# Patient Record
Sex: Female | Born: 1975 | Race: White | Hispanic: No | Smoking: Never smoker
Health system: Southern US, Community
[De-identification: ages and names within clinical notes are randomized; demographics above are authoritative.]

## PROBLEM LIST (undated history)

## (undated) DIAGNOSIS — F419 Anxiety disorder, unspecified: Secondary | ICD-10-CM

## (undated) DIAGNOSIS — K219 Gastro-esophageal reflux disease without esophagitis: Secondary | ICD-10-CM

## (undated) DIAGNOSIS — R519 Headache, unspecified: Secondary | ICD-10-CM

## (undated) DIAGNOSIS — C50919 Malignant neoplasm of unspecified site of unspecified female breast: Secondary | ICD-10-CM

## (undated) DIAGNOSIS — F909 Attention-deficit hyperactivity disorder, unspecified type: Secondary | ICD-10-CM

## (undated) DIAGNOSIS — N809 Endometriosis, unspecified: Secondary | ICD-10-CM

## (undated) DIAGNOSIS — F325 Major depressive disorder, single episode, in full remission: Secondary | ICD-10-CM

## (undated) DIAGNOSIS — Z87442 Personal history of urinary calculi: Secondary | ICD-10-CM

## (undated) DIAGNOSIS — F32A Depression, unspecified: Secondary | ICD-10-CM

## (undated) DIAGNOSIS — Z1379 Encounter for other screening for genetic and chromosomal anomalies: Secondary | ICD-10-CM

## (undated) DIAGNOSIS — R51 Headache: Secondary | ICD-10-CM

## (undated) DIAGNOSIS — F329 Major depressive disorder, single episode, unspecified: Secondary | ICD-10-CM

## (undated) HISTORY — DX: Major depressive disorder, single episode, in full remission: F32.5

## (undated) HISTORY — DX: Depression, unspecified: F32.A

## (undated) HISTORY — PX: TONSILLECTOMY: SUR1361

## (undated) HISTORY — DX: Encounter for other screening for genetic and chromosomal anomalies: Z13.79

## (undated) HISTORY — DX: Attention-deficit hyperactivity disorder, unspecified type: F90.9

## (undated) HISTORY — DX: Major depressive disorder, single episode, unspecified: F32.9

## (undated) HISTORY — DX: Endometriosis, unspecified: N80.9

---

## 2003-03-09 DIAGNOSIS — N809 Endometriosis, unspecified: Secondary | ICD-10-CM

## 2003-03-09 HISTORY — PX: OTHER SURGICAL HISTORY: SHX169

## 2003-03-09 HISTORY — DX: Endometriosis, unspecified: N80.9

## 2005-05-17 ENCOUNTER — Ambulatory Visit: Payer: Self-pay | Admitting: Gynecology

## 2005-05-31 ENCOUNTER — Encounter: Admission: RE | Admit: 2005-05-31 | Discharge: 2005-05-31 | Payer: Self-pay | Admitting: Gynecology

## 2007-05-02 ENCOUNTER — Other Ambulatory Visit: Payer: Self-pay

## 2007-05-02 ENCOUNTER — Emergency Department: Payer: Self-pay | Admitting: Emergency Medicine

## 2009-11-19 ENCOUNTER — Inpatient Hospital Stay: Payer: Self-pay | Admitting: Psychiatry

## 2010-08-29 ENCOUNTER — Emergency Department: Payer: Self-pay | Admitting: Internal Medicine

## 2010-10-29 LAB — HM MAMMOGRAPHY

## 2010-10-29 LAB — HM PAP SMEAR

## 2014-09-24 ENCOUNTER — Ambulatory Visit
Admission: RE | Admit: 2014-09-24 | Discharge: 2014-09-24 | Disposition: A | Discharge status: Home / Self Care | Payer: Worker's Compensation | Source: Ambulatory Visit | Attending: Physician Assistant | Admitting: Physician Assistant

## 2014-09-24 ENCOUNTER — Other Ambulatory Visit: Payer: Self-pay | Admitting: Physician Assistant

## 2014-09-24 DIAGNOSIS — S66812A Strain of other specified muscles, fascia and tendons at wrist and hand level, left hand, initial encounter: Secondary | ICD-10-CM | POA: Diagnosis not present

## 2014-09-24 DIAGNOSIS — S56212A Strain of other flexor muscle, fascia and tendon at forearm level, left arm, initial encounter: Secondary | ICD-10-CM

## 2014-09-24 DIAGNOSIS — Y939 Activity, unspecified: Secondary | ICD-10-CM | POA: Insufficient documentation

## 2014-09-24 DIAGNOSIS — X58XXXA Exposure to other specified factors, initial encounter: Secondary | ICD-10-CM | POA: Insufficient documentation

## 2014-09-24 DIAGNOSIS — S59902A Unspecified injury of left elbow, initial encounter: Secondary | ICD-10-CM | POA: Diagnosis present

## 2014-10-23 ENCOUNTER — Other Ambulatory Visit: Payer: Self-pay | Admitting: Physician Assistant

## 2014-11-28 ENCOUNTER — Other Ambulatory Visit: Payer: Self-pay | Admitting: Physician Assistant

## 2015-02-18 DIAGNOSIS — E669 Obesity, unspecified: Secondary | ICD-10-CM

## 2015-02-18 DIAGNOSIS — Z6841 Body Mass Index (BMI) 40.0 and over, adult: Secondary | ICD-10-CM

## 2015-02-18 DIAGNOSIS — J309 Allergic rhinitis, unspecified: Secondary | ICD-10-CM | POA: Insufficient documentation

## 2015-02-18 DIAGNOSIS — M545 Low back pain, unspecified: Secondary | ICD-10-CM | POA: Insufficient documentation

## 2015-02-18 DIAGNOSIS — J45909 Unspecified asthma, uncomplicated: Secondary | ICD-10-CM | POA: Insufficient documentation

## 2015-02-18 DIAGNOSIS — F325 Major depressive disorder, single episode, in full remission: Secondary | ICD-10-CM | POA: Insufficient documentation

## 2015-02-18 DIAGNOSIS — E66813 Obesity, class 3: Secondary | ICD-10-CM | POA: Insufficient documentation

## 2015-02-18 DIAGNOSIS — F909 Attention-deficit hyperactivity disorder, unspecified type: Secondary | ICD-10-CM | POA: Insufficient documentation

## 2015-02-18 HISTORY — DX: Major depressive disorder, single episode, in full remission: F32.5

## 2015-02-19 ENCOUNTER — Encounter: Payer: Self-pay | Admitting: Unknown Physician Specialty

## 2015-02-19 ENCOUNTER — Ambulatory Visit (INDEPENDENT_AMBULATORY_CARE_PROVIDER_SITE_OTHER): Payer: BLUE CROSS/BLUE SHIELD | Admitting: Unknown Physician Specialty

## 2015-02-19 VITALS — BP 139/82 | HR 80 | Temp 98.7°F | Ht 62.6 in | Wt 240.6 lb

## 2015-02-19 DIAGNOSIS — K9041 Non-celiac gluten sensitivity: Secondary | ICD-10-CM

## 2015-02-19 DIAGNOSIS — F909 Attention-deficit hyperactivity disorder, unspecified type: Secondary | ICD-10-CM

## 2015-02-19 DIAGNOSIS — Z0000 Encounter for general adult medical examination without abnormal findings: Secondary | ICD-10-CM | POA: Diagnosis not present

## 2015-02-19 DIAGNOSIS — E669 Obesity, unspecified: Secondary | ICD-10-CM | POA: Diagnosis not present

## 2015-02-19 MED ORDER — NALTREXONE-BUPROPION HCL ER 8-90 MG PO TB12: 8.0000 mg | ORAL_TABLET | Freq: Two times a day (BID) | ORAL | Status: DC

## 2015-02-19 NOTE — Assessment & Plan Note (Signed)
Rx for contrave

## 2015-02-19 NOTE — Assessment & Plan Note (Signed)
Will consider rx at a later date after starting Contrave

## 2015-02-19 NOTE — Progress Notes (Signed)
a  BP 139/82 mmHg  Pulse 80  Temp(Src) 98.7 F (37.1 C)  Ht 5' 2.6" (1.59 m)  Wt 240 lb 9.6 oz (109.135 kg)  BMI 43.17 kg/m2  SpO2 98%  LMP 01/29/2015 (Approximate)   Subjective:    Patient ID: Shannon Allen, female    DOB: 12-Sep-1975, 39 y.o.   MRN: [id number]  HPI: Shannon Allen is a 39 y.o. female  Chief Complaint  Patient presents with  . Annual Exam    pt states she wants to see about getting on Contrave and something for ADHD   Obesity Pt does pap smears at [facility].  They started her on Contrave which worked well for her and took it for for about 6 months.  States it worked until stopped it and discovered Teacher, English as a foreign language to go."  ADHD   States she would like to start ADHD medication again because she "can't remember" to manage things she needs to manage.  She was seen 2 years ago and started ADHD meds and lost her insurance and was lost to f/u.   States she has difficulty with conversation and finds that she cuts people off.  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  She states the ADHD causes social, work difficulties, and has trouble completing personal tasks such as housework.    Relevant past medical, surgical, family and social history reviewed and updated as indicated. Interim medical history since our last visit reviewed. Allergies and medications reviewed and updated.  Review of Systems  Constitutional: Negative.   HENT: Negative.   Eyes: Negative.   Respiratory: Negative.   Cardiovascular: Negative.   Gastrointestinal: Negative.        Having trouble with nausea and diarrhea with wheat  Endocrine: Negative.   Genitourinary: Negative.   Musculoskeletal: Negative.        Periodic left lower arm pain from a work injury.  Takes the occasional Meloxicam  Skin: Negative.   Allergic/Immunologic: Negative.   Neurological: Negative.   Hematological: Negative.   Psychiatric/Behavioral: Negative.      Per HPI unless specifically indicated above     Objective:    BP 139/82 mmHg  Pulse 80  Temp(Src) 98.7 F (37.1 C)  Ht 5' 2.6" (1.59 m)  Wt 240 lb 9.6 oz (109.135 kg)  BMI 43.17 kg/m2  SpO2 98%  LMP 01/29/2015 (Approximate)  Wt Readings from Last 3 Encounters:  02/19/15 240 lb 9.6 oz (109.135 kg)  10/30/12 211 lb (95.709 kg)    Physical Exam  Constitutional: She is oriented to person, place, and time. She appears well-developed and well-nourished.  HENT:  Head: Normocephalic and atraumatic.  Eyes: Pupils are equal, round, and reactive to light. Right eye exhibits no discharge. Left eye exhibits no discharge. No scleral icterus.  Neck: Normal range of motion. Neck supple. Carotid bruit is not present. No thyromegaly present.  Cardiovascular: Normal rate, regular rhythm and normal heart sounds.  Exam reveals no gallop and no friction rub.   No murmur heard. Pulmonary/Chest: Effort normal and breath sounds normal. No respiratory distress. She has no wheezes. She has no rales.  Abdominal: Soft. Bowel sounds are normal. There is no tenderness. There is no rebound.  Musculoskeletal: Normal range of motion.  Lymphadenopathy:    She has no cervical adenopathy.  Neurological: She is alert and oriented to person, place, and time.  Skin: Skin is warm, dry and intact. No rash noted.  Psychiatric: She has a normal mood and affect. Her speech is normal and behavior is normal. Judgment and thought content normal. Cognition and memory are normal.    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

## 2015-02-20 ENCOUNTER — Encounter: Payer: Self-pay | Admitting: Unknown Physician Specialty

## 2015-02-20 LAB — HIV ANTIBODY (ROUTINE TESTING W REFLEX): HIV Screen 4th Generation wRfx: NONREACTIVE

## 2015-02-20 LAB — CBC WITH DIFFERENTIAL/PLATELET
BASOS ABS: 0 10*3/uL (ref 0.0–0.2)
BASOS: 0 %
EOS (ABSOLUTE): 0.2 10*3/uL (ref 0.0–0.4)
EOS: 2 %
Hematocrit: 38.7 % (ref 34.0–46.6)
Hemoglobin: 12.6 g/dL (ref 11.1–15.9)
IMMATURE GRANS (ABS): 0 10*3/uL (ref 0.0–0.1)
Immature Granulocytes: 0 %
LYMPHS ABS: 3.1 10*3/uL (ref 0.7–3.1)
Lymphs: 37 %
MCH: 29 pg (ref 26.6–33.0)
MCHC: 32.6 g/dL (ref 31.5–35.7)
MCV: 89 fL (ref 79–97)
MONOCYTES: 6 %
MONOS ABS: 0.5 10*3/uL (ref 0.1–0.9)
Neutrophils Absolute: 4.6 10*3/uL (ref 1.4–7.0)
Neutrophils: 55 %
PLATELETS: 403 10*3/uL — AB (ref 150–379)
RBC: 4.34 x10E6/uL (ref 3.77–5.28)
RDW: 13.6 % (ref 12.3–15.4)
WBC: 8.4 10*3/uL (ref 3.4–10.8)

## 2015-02-20 LAB — COMPREHENSIVE METABOLIC PANEL
ALBUMIN: 3.9 g/dL (ref 3.5–5.5)
ALT: 15 IU/L (ref 0–32)
AST: 13 IU/L (ref 0–40)
Albumin/Globulin Ratio: 1.5 (ref 1.1–2.5)
Alkaline Phosphatase: 48 IU/L (ref 39–117)
BUN / CREAT RATIO: 23 — AB (ref 8–20)
BUN: 15 mg/dL (ref 6–20)
Bilirubin Total: 0.3 mg/dL (ref 0.0–1.2)
CALCIUM: 9.4 mg/dL (ref 8.7–10.2)
CO2: 24 mmol/L (ref 18–29)
CREATININE: 0.66 mg/dL (ref 0.57–1.00)
Chloride: 101 mmol/L (ref 96–106)
GFR calc Af Amer: 130 mL/min/{1.73_m2} (ref >59)
GFR, EST NON AFRICAN AMERICAN: 112 mL/min/{1.73_m2} (ref >59)
GLOBULIN, TOTAL: 2.6 g/dL (ref 1.5–4.5)
GLUCOSE: 98 mg/dL (ref 65–99)
Potassium: 4.3 mmol/L (ref 3.5–5.2)
SODIUM: 140 mmol/L (ref 134–144)
TOTAL PROTEIN: 6.5 g/dL (ref 6.0–8.5)

## 2015-02-20 LAB — TSH: TSH: 1.36 u[IU]/mL (ref 0.450–4.500)

## 2015-02-20 LAB — LIPID PANEL W/O CHOL/HDL RATIO
Cholesterol, Total: 168 mg/dL (ref 100–199)
HDL: 40 mg/dL (ref >39)
LDL Calculated: 91 mg/dL (ref 0–99)
Triglycerides: 185 mg/dL — ABNORMAL HIGH (ref 0–149)
VLDL Cholesterol Cal: 37 mg/dL (ref 5–40)

## 2015-02-20 LAB — VITAMIN D 25 HYDROXY (VIT D DEFICIENCY, FRACTURES): Vit D, 25-Hydroxy: 36.7 ng/mL (ref 30.0–100.0)

## 2015-02-20 NOTE — Progress Notes (Signed)
Quick Note:  Normal labs. Patient notified by letter. ______ 

## 2015-02-21 LAB — TISSUE TRANSGLUTAMINASE, IGA: Transglutaminase IgA: <2 U/mL (ref 0–3)

## 2015-02-26 ENCOUNTER — Telehealth: Payer: Self-pay | Admitting: Unknown Physician Specialty

## 2015-02-26 ENCOUNTER — Telehealth: Payer: Self-pay

## 2015-02-26 NOTE — Telephone Encounter (Signed)
Patient called and states she is confused on how she is supposed to be taking her contrave. She wants to know if she should be taking one tablet twice a day or 2 tablets twice a day. Patient also asker about her labs because the letter she got stated that she had all normal results but some of her numbers were higher than range. She wants to know if she should be doing anything about them.

## 2015-02-26 NOTE — Telephone Encounter (Signed)
Call pt  Patient called and states she is confused on how she is supposed to be taking her contrave. She wants to know if she should be taking one tablet twice a day or 2 tablets twice a day. Patient also asker about her labs because the letter she got stated that she had all normal results but some of her numbers were higher than range. She wants to know if she should be doing anything about them.

## 2015-02-27 MED ORDER — NALTREXONE-BUPROPION HCL ER 8-90 MG PO TB12: 8.0000 mg | ORAL_TABLET | Freq: Two times a day (BID) | ORAL | Status: DC

## 2015-02-27 NOTE — Telephone Encounter (Signed)
Phone call Patient has been following increase medicine is gotten up to 2 in the morning 2 in the evening of medication prescription and despite of the way it was written by Malachy Mood was not filled that way gave patient a new prescription

## 2015-03-06 ENCOUNTER — Telehealth: Payer: Self-pay

## 2015-03-06 MED ORDER — NALTREXONE-BUPROPION HCL ER 8-90 MG PO TB12: 16.0000 mg | ORAL_TABLET | Freq: Two times a day (BID) | ORAL | Status: DC

## 2015-03-06 NOTE — Telephone Encounter (Signed)
It is 2 pills twice a day and I sent a new rx.

## 2015-03-06 NOTE — Telephone Encounter (Signed)
Pharmacy called and have questions about the patient's naltrexone-bupropion prescription. They state the prescription reads that the patient should be taking 2 tablets daily, one at 10 am and one at 5 pm. But the patient says she takes 4 tablets daily. They need to know which is correct.

## 2015-03-06 NOTE — Telephone Encounter (Signed)
I faxed over the new prescription and then called the pharmacy to make sure they got it and understood that it was 2 tablets twice daily.

## 2015-05-16 NOTE — Telephone Encounter (Signed)
Erroneous entry

## 2015-05-28 ENCOUNTER — Ambulatory Visit: Payer: BLUE CROSS/BLUE SHIELD | Admitting: Unknown Physician Specialty

## 2015-07-17 ENCOUNTER — Other Ambulatory Visit: Payer: Self-pay | Admitting: Obstetrics & Gynecology

## 2016-08-24 ENCOUNTER — Other Ambulatory Visit: Payer: Self-pay | Admitting: Obstetrics & Gynecology

## 2016-10-15 ENCOUNTER — Encounter: Payer: Self-pay | Admitting: Family Medicine

## 2016-10-15 ENCOUNTER — Ambulatory Visit (INDEPENDENT_AMBULATORY_CARE_PROVIDER_SITE_OTHER): Payer: BLUE CROSS/BLUE SHIELD | Admitting: Family Medicine

## 2016-10-15 VITALS — BP 125/87 | HR 79 | Temp 98.8°F | Wt 251.0 lb

## 2016-10-15 DIAGNOSIS — R600 Localized edema: Secondary | ICD-10-CM

## 2016-10-15 DIAGNOSIS — Z0000 Encounter for general adult medical examination without abnormal findings: Secondary | ICD-10-CM

## 2016-10-15 DIAGNOSIS — M25511 Pain in right shoulder: Secondary | ICD-10-CM | POA: Diagnosis not present

## 2016-10-15 MED ORDER — PREDNISONE 20 MG PO TABS: 40.0000 mg | ORAL_TABLET | Freq: Every day | ORAL | 0 refills | Status: DC

## 2016-10-15 NOTE — Progress Notes (Signed)
BP 125/87   Pulse 79   Temp 98.8 F (37.1 C)   Wt 251 lb (113.9 kg)   LMP 08/15/2016 (Approximate) Comment: on the 3 month BCP  SpO2 97%   BMI 45.03 kg/m    Subjective:    Patient ID: Shannon Allen, female    DOB: 1975-08-18, 41 y.o.   MRN: [id number]  HPI: Shannon Allen is a 41 y.o. female  Chief Complaint  Patient presents with  . Shoulder Pain    right shoulder, constant since Monday. No known injury. Sharp pain. If she rotates it, it pops.   . Edema    bilateral feet since May  . Annual Exam    also would like form filled out for foster care; gets breast/pap done at [facility].   Patient with constant sharp right shoulder pain and popping x 4-5 days. Denies known injury, hx of shoulder issues on this side, repetitive movements, new exercises, or recent hx of heavy lifting. Does note she sleeps on this side nightly. Trying icy hot with some relief, then took ibuprofen yesterday and and this morning which seemed to help. Pain seems to be under good control this morning on NSAIDs.   Also having b/l ankle and foot edema x 3-4 months. Has never happened to her before. Not trying compression or elevation. Eats a low salt diet. Does not regularly exercise. Denies leg pain, redness, rashes, CP, SOB, palpitations.   Needs physical and form completed today for foster care as well. [facility], gets pap and breast exams done through them. Not fasting today for labs.   Past Medical History:  Diagnosis Date  . ADHD (attention deficit hyperactivity disorder)   . Depression   . Endometriosis determined by laparoscopy    Social History   Social History  . Marital status: Single    Spouse name: N/A  . Number of children: N/A  . Years of education: N/A   Occupational History  . Not on file.   Social History Main Topics  . Smoking status: Never Smoker  . Smokeless tobacco: Never Used  . Alcohol use 0.0 oz/week     Comment: on occasion  . Drug use: No  . Sexual  activity: Not Currently   Other Topics Concern  . Not on file   Social History Narrative  . No narrative on file   Relevant past medical, surgical, family and social history reviewed and updated as indicated. Interim medical history since our last visit reviewed. Allergies and medications reviewed and updated.  Review of Systems  Constitutional: Negative.   HENT: Negative.   Eyes: Negative.   Respiratory: Negative.   Cardiovascular: Positive for leg swelling.  Gastrointestinal: Negative.   Genitourinary: Negative.   Musculoskeletal: Positive for arthralgias.  Neurological: Negative.   Psychiatric/Behavioral: Negative.    Per HPI unless specifically indicated above     Objective:    BP 125/87   Pulse 79   Temp 98.8 F (37.1 C)   Wt 251 lb (113.9 kg)   LMP 08/15/2016 (Approximate) Comment: on the 3 month BCP  SpO2 97%   BMI 45.03 kg/m   Wt Readings from Last 3 Encounters:  10/15/16 251 lb (113.9 kg)  02/19/15 240 lb 9.6 oz (109.1 kg)  10/30/12 211 lb (95.7 kg)    Physical Exam  Constitutional: She is oriented to person, place, and time. She appears well-developed and well-nourished. No distress.  HENT:  Head: Atraumatic.  Right Ear: External ear  normal.  Left Ear: External ear normal.  Nose: Nose normal.  Mouth/Throat: Oropharynx is clear and moist. No oropharyngeal exudate.  Eyes: Pupils are equal, round, and reactive to light. Conjunctivae are normal. No scleral icterus.  Neck: Normal range of motion. Neck supple. No thyromegaly present.  Cardiovascular: Normal rate, regular rhythm, normal heart sounds and intact distal pulses.   Pulmonary/Chest: Effort normal and breath sounds normal. No respiratory distress.  Breast exam deferred to GYN  Abdominal: Soft. Bowel sounds are normal. She exhibits no mass. There is no tenderness.  Genitourinary:  Genitourinary Comments: Exam deferred to GYN  Musculoskeletal: She exhibits edema (B/l LE 1+ pitting edema). She  exhibits no tenderness or deformity.  Mild ROM limitation due to pain Exam of right shoulder largely benign today as pain seems to have improved with NSAIDs  Lymphadenopathy:    She has no cervical adenopathy.  Neurological: She is alert and oriented to person, place, and time. No cranial nerve deficit.  Skin: Skin is warm and dry. No rash noted.  Psychiatric: She has a normal mood and affect. Her behavior is normal.  Nursing note and vitals reviewed.     Assessment & Plan:   Problem List Items Addressed This Visit    None    Visit Diagnoses    Annual physical exam    -  Primary   Forms completed and scanned for foster care. Non-fasting labs drawn, await results   Relevant Orders   CBC with Differential/Platelet   Comprehensive metabolic panel   Lipid Panel w/o Chol/HDL Ratio   TSH   UA/M w/rflx Culture, Routine   Acute pain of right shoulder       Will try some prednisone, stretches, and massage. D/c sleeping on the shoulder. Continue OTC pain relievers and icy hot. Will get imaging if no improvement   Bilateral leg edema       Will start12.5 mg HCTZ, compression stockings, and leg elevation. Increase exercise, work on weight loss. Continue low sodium diet. CMP today       Follow up plan: Return in about 4 weeks (around 11/12/2016) for BMP, BP check.

## 2016-10-18 ENCOUNTER — Other Ambulatory Visit: Payer: Self-pay

## 2016-10-18 DIAGNOSIS — Z0000 Encounter for general adult medical examination without abnormal findings: Secondary | ICD-10-CM

## 2016-10-18 NOTE — Patient Instructions (Addendum)
Follow up in 1 month for BP check on HCTZ

## 2016-10-19 ENCOUNTER — Encounter: Payer: Self-pay | Admitting: Family Medicine

## 2016-10-19 LAB — COMPREHENSIVE METABOLIC PANEL
A/G RATIO: 1.7 (ref 1.2–2.2)
ALBUMIN: 3.8 g/dL (ref 3.5–5.5)
ALK PHOS: 40 IU/L (ref 39–117)
ALT: 24 IU/L (ref 0–32)
AST: 14 IU/L (ref 0–40)
BUN / CREAT RATIO: 23 (ref 9–23)
BUN: 14 mg/dL (ref 6–24)
CO2: 22 mmol/L (ref 20–29)
Calcium: 8.8 mg/dL (ref 8.7–10.2)
Chloride: 108 mmol/L — ABNORMAL HIGH (ref 96–106)
Creatinine, Ser: 0.6 mg/dL (ref 0.57–1.00)
GFR calc Af Amer: 132 mL/min/{1.73_m2} (ref >59)
GFR calc non Af Amer: 114 mL/min/{1.73_m2} (ref >59)
GLOBULIN, TOTAL: 2.3 g/dL (ref 1.5–4.5)
Glucose: 86 mg/dL (ref 65–99)
POTASSIUM: 4.1 mmol/L (ref 3.5–5.2)
SODIUM: 143 mmol/L (ref 134–144)
Total Protein: 6.1 g/dL (ref 6.0–8.5)

## 2016-10-19 LAB — CBC WITH DIFFERENTIAL/PLATELET
Basophils Absolute: 0 10*3/uL (ref 0.0–0.2)
Basos: 0 %
EOS (ABSOLUTE): 0.1 10*3/uL (ref 0.0–0.4)
EOS: 1 %
HEMATOCRIT: 37.5 % (ref 34.0–46.6)
Hemoglobin: 12.1 g/dL (ref 11.1–15.9)
IMMATURE GRANULOCYTES: 0 %
Immature Grans (Abs): 0 10*3/uL (ref 0.0–0.1)
LYMPHS ABS: 4.2 10*3/uL — AB (ref 0.7–3.1)
Lymphs: 39 %
MCH: 29 pg (ref 26.6–33.0)
MCHC: 32.3 g/dL (ref 31.5–35.7)
MCV: 90 fL (ref 79–97)
MONOS ABS: 0.6 10*3/uL (ref 0.1–0.9)
Monocytes: 5 %
NEUTROS PCT: 55 %
Neutrophils Absolute: 5.9 10*3/uL (ref 1.4–7.0)
PLATELETS: 376 10*3/uL (ref 150–379)
RBC: 4.17 x10E6/uL (ref 3.77–5.28)
RDW: 14.5 % (ref 12.3–15.4)
WBC: 10.8 10*3/uL (ref 3.4–10.8)

## 2016-10-19 LAB — TSH: TSH: 2.37 u[IU]/mL (ref 0.450–4.500)

## 2016-10-19 LAB — LIPID PANEL W/O CHOL/HDL RATIO
CHOLESTEROL TOTAL: 150 mg/dL (ref 100–199)
HDL: 44 mg/dL (ref >39)
LDL Calculated: 84 mg/dL (ref 0–99)
TRIGLYCERIDES: 110 mg/dL (ref 0–149)
VLDL CHOLESTEROL CAL: 22 mg/dL (ref 5–40)

## 2016-10-20 LAB — URINE CULTURE, REFLEX

## 2016-10-20 LAB — UA/M W/RFLX CULTURE, ROUTINE
BILIRUBIN UA: NEGATIVE
KETONES UA: NEGATIVE
NITRITE UA: NEGATIVE
SPEC GRAV UA: 1.025 (ref 1.005–1.030)
Urobilinogen, Ur: 0.2 mg/dL (ref 0.2–1.0)
pH, UA: 6 (ref 5.0–7.5)

## 2016-10-20 LAB — MICROSCOPIC EXAMINATION

## 2016-11-18 ENCOUNTER — Ambulatory Visit: Payer: Self-pay | Admitting: Obstetrics & Gynecology

## 2016-11-20 ENCOUNTER — Other Ambulatory Visit: Payer: Self-pay | Admitting: Obstetrics & Gynecology

## 2016-11-30 ENCOUNTER — Encounter: Payer: Self-pay | Admitting: Obstetrics & Gynecology

## 2016-11-30 ENCOUNTER — Ambulatory Visit (INDEPENDENT_AMBULATORY_CARE_PROVIDER_SITE_OTHER): Payer: BLUE CROSS/BLUE SHIELD | Admitting: Obstetrics & Gynecology

## 2016-11-30 VITALS — BP 120/80 | HR 100 | Ht 63.0 in | Wt 250.0 lb

## 2016-11-30 DIAGNOSIS — Z124 Encounter for screening for malignant neoplasm of cervix: Secondary | ICD-10-CM | POA: Diagnosis not present

## 2016-11-30 DIAGNOSIS — F3342 Major depressive disorder, recurrent, in full remission: Secondary | ICD-10-CM

## 2016-11-30 DIAGNOSIS — Z6841 Body Mass Index (BMI) 40.0 and over, adult: Secondary | ICD-10-CM

## 2016-11-30 DIAGNOSIS — Z1231 Encounter for screening mammogram for malignant neoplasm of breast: Secondary | ICD-10-CM | POA: Diagnosis not present

## 2016-11-30 DIAGNOSIS — Z01419 Encounter for gynecological examination (general) (routine) without abnormal findings: Secondary | ICD-10-CM | POA: Diagnosis not present

## 2016-11-30 DIAGNOSIS — Z0000 Encounter for general adult medical examination without abnormal findings: Secondary | ICD-10-CM | POA: Insufficient documentation

## 2016-11-30 DIAGNOSIS — Z1239 Encounter for other screening for malignant neoplasm of breast: Secondary | ICD-10-CM

## 2016-11-30 MED ORDER — PHENTERMINE HCL 37.5 MG PO TABS: 37.5000 mg | ORAL_TABLET | Freq: Every day | ORAL | 0 refills | Status: DC

## 2016-11-30 MED ORDER — LEVONORGEST-ETH ESTRAD 91-DAY 0.15-0.03 &0.01 MG PO TABS: 1.0000 | ORAL_TABLET | Freq: Every day | ORAL | 3 refills | Status: DC

## 2016-11-30 MED ORDER — DICLOFENAC POTASSIUM 50 MG PO TABS: 50.0000 mg | ORAL_TABLET | Freq: Two times a day (BID) | ORAL | 6 refills | Status: AC

## 2016-11-30 NOTE — Patient Instructions (Signed)
PAP every three years Mammogram every year    Call [phone] to schedule at [facility] yearly (with PCP)   

## 2016-11-30 NOTE — Progress Notes (Signed)
HPI:      [name] is a 41 y.o. G0P0000 who LMP was Patient's last menstrual period was 11/24/2016., she presents today for her annual examination. The patient has no complaints today. The patient is sexually active. Her last pap: approximate date 2015 and was normal and last mammogram: approximate date 2017 and was normal. The patient does perform self breast exams.  There is no notable family history of breast or ovarian cancer in her family.  The patient has regular exercise: yes.  The patient denies current symptoms of depression.  Weight gain a concern  GYN History: Contraception: OCP (estrogen/progesterone)  PMHx: Past Medical History:  Diagnosis Date  . ADHD (attention deficit hyperactivity disorder)   . Depression   . Endometriosis determined by laparoscopy    Past Surgical History:  Procedure Laterality Date  . laproscopic surgery    . TONSILLECTOMY     Family History  Problem Relation Age of Onset  . Cancer Mother        breast  . Hyperlipidemia Mother   . Kidney disease Brother   . Hypertension Brother   . Hyperlipidemia Brother   . Alzheimer's disease Maternal Grandmother   . Heart disease Maternal Grandfather   . Cancer Paternal Grandfather        lung   Social History  Substance Use Topics  . Smoking status: Never Smoker  . Smokeless tobacco: Never Used  . Alcohol use 0.0 oz/week     Comment: on occasion    Current Outpatient Prescriptions:  .  cetirizine (ZYRTEC) 10 MG tablet, Take 10 mg by mouth daily., Disp: , Rfl:  .  diclofenac (CATAFLAM) 50 MG tablet, Take 50 mg by mouth daily as needed., Disp: , Rfl:  .  fluticasone (FLONASE) 50 MCG/ACT nasal spray, Place 2 sprays into both nostrils daily., Disp: , Rfl:  .  Levonorgestrel-Ethinyl Estradiol (AMETHIA,CAMRESE) 0.15-0.03 &0.01 MG tablet, TAKE 1 TABLET BY MOUTH EVERY DAY, Disp: 91 tablet, Rfl: 0 .  predniSONE (DELTASONE) 20 MG tablet, Take 2 tablets (40 mg total) by mouth daily with  breakfast. (Patient not taking: Reported on 11/30/2016), Disp: 10 tablet, Rfl: 0 Allergies: Erythromycin  Review of Systems  Constitutional: Negative for chills, fever and malaise/fatigue.  HENT: Negative for congestion, sinus pain and sore throat.   Eyes: Negative for blurred vision and pain.  Respiratory: Negative for cough and wheezing.   Cardiovascular: Negative for chest pain and leg swelling.  Gastrointestinal: Negative for abdominal pain, constipation, diarrhea, heartburn, nausea and vomiting.  Genitourinary: Negative for dysuria, frequency, hematuria and urgency.  Musculoskeletal: Negative for back pain, joint pain, myalgias and neck pain.  Skin: Negative for itching and rash.  Neurological: Negative for dizziness, tremors and weakness.  Endo/Heme/Allergies: Does not bruise/bleed easily.  Psychiatric/Behavioral: Negative for depression. The patient is not nervous/anxious and does not have insomnia.     Objective: BP 120/80   Pulse 100   Ht 5\' 3"  (1.6 m)   Wt 250 lb (113.4 kg)   LMP 11/24/2016   BMI 44.29 kg/m   Filed Weights   11/30/16 0944  Weight: 250 lb (113.4 kg)   Body mass index is 44.29 kg/m. Physical Exam  Constitutional: She is oriented to person, place, and time. She appears well-developed and well-nourished. No distress.  Genitourinary: Rectum normal, vagina normal and uterus normal. Pelvic exam was performed with patient supine. There is no rash or lesion on the right labia. There is no rash or lesion on the  left labia. Vagina exhibits no lesion. No bleeding in the vagina. Right adnexum does not display mass and does not display tenderness. Left adnexum does not display mass and does not display tenderness. Cervix does not exhibit motion tenderness, lesion, friability or polyp.   Uterus is mobile and midaxial. Uterus is not enlarged or exhibiting a mass.  HENT:  Head: Normocephalic and atraumatic. Head is without laceration.  Right Ear: Hearing normal.  Left  Ear: Hearing normal.  Nose: No epistaxis.  No foreign bodies.  Mouth/Throat: Uvula is midline, oropharynx is clear and moist and mucous membranes are normal.  Eyes: Pupils are equal, round, and reactive to light.  Neck: Normal range of motion. Neck supple. No thyromegaly present.  Cardiovascular: Normal rate and regular rhythm.  Exam reveals no gallop and no friction rub.   No murmur heard. Pulmonary/Chest: Effort normal and breath sounds normal. No respiratory distress. She has no wheezes. Right breast exhibits no mass, no skin change and no tenderness. Left breast exhibits no mass, no skin change and no tenderness.  Abdominal: Soft. Bowel sounds are normal. She exhibits no distension. There is no tenderness. There is no rebound.  Musculoskeletal: Normal range of motion.  Neurological: She is alert and oriented to person, place, and time. No cranial nerve deficit.  Skin: Skin is warm and dry.  Psychiatric: She has a normal mood and affect. Judgment normal.  Vitals reviewed.  Assessment: 1. Annual physical exam  2. Screening for breast cancer - MM DIGITAL SCREENING BILATERAL; Future  3. Screening for cervical cancer - IGP, Aptima HPV  4. Class 3 severe obesity due to excess calories without serious comorbidity with body mass index (BMI) of 40.0 to 44.9 in adult ([place]) Counseled on weight loss strategies Med Rx today Consider referral Bariatrics  5. Labs managed by PCP  6. Counseling for contraception: Cont OCP (q 3 mos period)    F/U  Return in about 1 year (around 11/30/2017) for Annual.  Barnett Applebaum, MD, [facility], [facility] 11/30/2016  9:50 AM

## 2016-12-02 LAB — IGP, APTIMA HPV
HPV APTIMA: NEGATIVE
PAP SMEAR COMMENT: 0

## 2016-12-31 ENCOUNTER — Encounter: Payer: Self-pay | Admitting: Obstetrics & Gynecology

## 2016-12-31 ENCOUNTER — Ambulatory Visit (INDEPENDENT_AMBULATORY_CARE_PROVIDER_SITE_OTHER): Payer: BLUE CROSS/BLUE SHIELD | Admitting: Obstetrics & Gynecology

## 2016-12-31 VITALS — BP 130/90 | Ht 63.0 in | Wt 241.0 lb

## 2016-12-31 DIAGNOSIS — Z6841 Body Mass Index (BMI) 40.0 and over, adult: Secondary | ICD-10-CM | POA: Diagnosis not present

## 2016-12-31 DIAGNOSIS — E8881 Metabolic syndrome: Secondary | ICD-10-CM | POA: Insufficient documentation

## 2016-12-31 MED ORDER — PHENTERMINE HCL 37.5 MG PO TABS: 37.5000 mg | ORAL_TABLET | Freq: Every day | ORAL | 1 refills | Status: DC

## 2016-12-31 NOTE — Progress Notes (Signed)
  History of Present Illness:  Shannon Allen is a 41 y.o. who was started on  Phentermine approximately 4 weeks ago due to obesity/abnormal weight gain. The patient has lost 9 pounds over the past month due to lifestyle changes and meds (phentermine).She has these side effects: nausea, on 2 occasions.  O/w tolerating well.  PMHx: She  has a past medical history of ADHD (attention deficit hyperactivity disorder); Depression; and Endometriosis determined by laparoscopy. Also,  has a past surgical history that includes Tonsillectomy and laproscopic surgery., family history includes Alzheimer's disease in her maternal grandmother; Cancer in her mother and paternal grandfather; Heart disease in her maternal grandfather; Hyperlipidemia in her brother and mother; Hypertension in her brother; Kidney disease in her brother.,  reports that she has never smoked. She has never used smokeless tobacco. She reports that she drinks alcohol. She reports that she does not use drugs.  She has a current medication list which includes the following prescription(s): cetirizine, diclofenac, fluticasone, levonorgestrel-ethinyl estradiol, phentermine, and prednisone. Also, is allergic to erythromycin.  Review of Systems  All other systems reviewed and are negative.  Physical Exam:  BP 130/90   Ht 5\' 3"  (1.6 m)   Wt 241 lb (109.3 kg)   BMI 42.69 kg/m  Body mass index is 42.69 kg/m. Filed Weights   12/31/16 0823  Weight: 241 lb (109.3 kg)                              Sept- 250 lb  Physical Exam  Constitutional: She is oriented to person, place, and time. She appears well-developed and well-nourished. No distress.  Musculoskeletal: Normal range of motion.  Neurological: She is alert and oriented to person, place, and time.  Skin: Skin is warm and dry.  Psychiatric: She has a normal mood and affect.  Vitals reviewed.  Assessment: morbid obesity, BMI 42, Dysmetabolic syndrome Medication treatment is going well  for her.  Plan: Patient is continued/added to prescription appetite suppressants: Phentermine, self-directed dieting and Exercise counseling.   Will continue to assist patient in incorporating positive experiences into her life to promote a positive mental attitude.  Education given regarding appropriate lifestyle changes for weight loss, including regular physical activity, healthy coping strategies, caloric restriction, and healthy eating patterns.  The risks and benefits as well as side effects of medication, such as Phenteramine or [place], is discussed.  The pros and cons of suppressing appetite and boosting metabolism is counseled.  Risks of tolerance and addiction discussed.  Use of medicine will be short term.  Pt to call with any negative side effects and agrees to keep follow up appointments.  A total of 15 minutes were spent face-to-face with the patient during this encounter and over half of that time dealt with counseling and coordination of care.  Barnett Applebaum, MD, [facility], [facility] 12/31/2016  8:38 AM

## 2016-12-31 NOTE — Patient Instructions (Signed)
Mammogram every year    Call [phone] to schedule at [facility]  

## 2017-01-21 ENCOUNTER — Ambulatory Visit
Admission: RE | Admit: 2017-01-21 | Discharge: 2017-01-21 | Disposition: A | Discharge status: Home / Self Care | Payer: BLUE CROSS/BLUE SHIELD | Source: Ambulatory Visit | Attending: Obstetrics & Gynecology | Admitting: Obstetrics & Gynecology

## 2017-01-21 DIAGNOSIS — Z1231 Encounter for screening mammogram for malignant neoplasm of breast: Secondary | ICD-10-CM | POA: Diagnosis present

## 2017-01-21 DIAGNOSIS — Z1239 Encounter for other screening for malignant neoplasm of breast: Secondary | ICD-10-CM

## 2017-02-08 ENCOUNTER — Other Ambulatory Visit: Payer: Self-pay | Admitting: *Deleted

## 2017-02-08 ENCOUNTER — Inpatient Hospital Stay
Admission: RE | Admit: 2017-02-08 | Discharge: 2017-02-08 | Disposition: A | Discharge status: Home / Self Care | Payer: Self-pay | Source: Ambulatory Visit | Attending: *Deleted | Admitting: *Deleted

## 2017-02-08 DIAGNOSIS — Z9289 Personal history of other medical treatment: Secondary | ICD-10-CM

## 2017-02-09 ENCOUNTER — Other Ambulatory Visit: Payer: Self-pay | Admitting: Obstetrics & Gynecology

## 2017-02-09 DIAGNOSIS — R928 Other abnormal and inconclusive findings on diagnostic imaging of breast: Secondary | ICD-10-CM

## 2017-02-09 DIAGNOSIS — R921 Mammographic calcification found on diagnostic imaging of breast: Secondary | ICD-10-CM

## 2017-02-15 ENCOUNTER — Ambulatory Visit
Admission: RE | Admit: 2017-02-15 | Discharge: 2017-02-15 | Disposition: A | Discharge status: Home / Self Care | Payer: BLUE CROSS/BLUE SHIELD | Source: Ambulatory Visit | Attending: Obstetrics & Gynecology | Admitting: Obstetrics & Gynecology

## 2017-02-15 DIAGNOSIS — R921 Mammographic calcification found on diagnostic imaging of breast: Secondary | ICD-10-CM

## 2017-02-15 DIAGNOSIS — R928 Other abnormal and inconclusive findings on diagnostic imaging of breast: Secondary | ICD-10-CM

## 2017-02-16 ENCOUNTER — Other Ambulatory Visit: Payer: Self-pay | Admitting: Obstetrics & Gynecology

## 2017-02-16 DIAGNOSIS — R928 Other abnormal and inconclusive findings on diagnostic imaging of breast: Secondary | ICD-10-CM

## 2017-02-16 DIAGNOSIS — R921 Mammographic calcification found on diagnostic imaging of breast: Secondary | ICD-10-CM

## 2017-03-02 ENCOUNTER — Encounter: Payer: Self-pay | Admitting: Obstetrics & Gynecology

## 2017-03-02 ENCOUNTER — Ambulatory Visit: Payer: BLUE CROSS/BLUE SHIELD | Admitting: Obstetrics & Gynecology

## 2017-03-02 VITALS — BP 120/80 | HR 86 | Ht 62.0 in | Wt 237.0 lb

## 2017-03-02 DIAGNOSIS — E8881 Metabolic syndrome: Secondary | ICD-10-CM | POA: Diagnosis not present

## 2017-03-02 DIAGNOSIS — Z6841 Body Mass Index (BMI) 40.0 and over, adult: Secondary | ICD-10-CM | POA: Diagnosis not present

## 2017-03-02 MED ORDER — LEVONORGEST-ETH ESTRAD 91-DAY 0.15-0.03 &0.01 MG PO TABS: 1.0000 | ORAL_TABLET | Freq: Every day | ORAL | 3 refills | Status: AC

## 2017-03-02 MED ORDER — PHENTERMINE HCL 37.5 MG PO TABS: 37.5000 mg | ORAL_TABLET | Freq: Every day | ORAL | 1 refills | Status: AC

## 2017-03-02 MED ORDER — ALPRAZOLAM 0.5 MG PO TABS: 0.5000 mg | ORAL_TABLET | Freq: Two times a day (BID) | ORAL | 0 refills | Status: AC | PRN

## 2017-03-02 NOTE — Progress Notes (Signed)
  History of Present Illness:  Shannon Allen is a 41 y.o. who was started on  Phentermine approximately 4 month ago due to obesity/abnormal weight gain. The patient has lost 4 pounds over the past 2 mos due to meds and lifestyle changes..   She has these side effects: none.  PMHx: She  has a past medical history of ADHD (attention deficit hyperactivity disorder), Depression, and Endometriosis determined by laparoscopy. Also,  has a past surgical history that includes Tonsillectomy and laproscopic surgery., family history includes Alzheimer's disease in her maternal grandmother; Cancer in her mother and paternal grandfather; Heart disease in her maternal grandfather; Hyperlipidemia in her brother and mother; Hypertension in her brother; Kidney disease in her brother.,  reports that  has never smoked. she has never used smokeless tobacco. She reports that she drinks alcohol. She reports that she does not use drugs.  She has a current medication list which includes the following prescription(s): alprazolam, cetirizine, diclofenac, fluticasone, levonorgestrel-ethinyl estradiol, phentermine, and prednisone. Also, is allergic to erythromycin.  ROS  Physical Exam:  BP 120/80   Pulse 86   Ht 5\' 2"  (1.575 m)   Wt 237 lb (107.5 kg)   BMI 43.35 kg/m  Body mass index is 43.35 kg/m. Filed Weights   03/02/17 0819  Weight: 237 lb (107.5 kg)    OBGyn Exam  Assessment: obesity (BMI 43), dysmetabolic syndrome Medication treatment is going adequately for her.  Plan: Patient will be continued/added to prescription appetite suppressants: Phentermine (holiday planned soon), self-directed dieting and exercise.   Patient doing well with weight loss in progress. Will stop medicine in 2 mos and allow for a drug-free holiday. Patient understands she may need future therapy if weight gain resumes or she does not reach her weight loss goals on her own with good diet and exercise habits.   A total of 15 minutes  were spent face-to-face with the patient during this encounter and over half of that time dealt with counseling and coordination of care.  Xanax for pre-procedure anxiety (Breast Biopsy next week) OCP Rx to [place], MD, [facility], [facility] 03/02/2017  8:36 AM

## 2017-03-08 DIAGNOSIS — C50919 Malignant neoplasm of unspecified site of unspecified female breast: Secondary | ICD-10-CM

## 2017-03-08 HISTORY — DX: Malignant neoplasm of unspecified site of unspecified female breast: C50.919

## 2017-03-09 ENCOUNTER — Ambulatory Visit
Admission: RE | Admit: 2017-03-09 | Discharge: 2017-03-09 | Disposition: A | Discharge status: Home / Self Care | Payer: BLUE CROSS/BLUE SHIELD | Source: Ambulatory Visit | Attending: Obstetrics & Gynecology | Admitting: Obstetrics & Gynecology

## 2017-03-09 DIAGNOSIS — R928 Other abnormal and inconclusive findings on diagnostic imaging of breast: Secondary | ICD-10-CM

## 2017-03-09 DIAGNOSIS — N6489 Other specified disorders of breast: Secondary | ICD-10-CM | POA: Diagnosis not present

## 2017-03-09 DIAGNOSIS — R921 Mammographic calcification found on diagnostic imaging of breast: Secondary | ICD-10-CM | POA: Diagnosis present

## 2017-03-09 HISTORY — PX: BREAST BIOPSY: SHX20

## 2017-03-10 LAB — SURGICAL PATHOLOGY

## 2017-03-11 ENCOUNTER — Telehealth: Payer: Self-pay

## 2017-03-11 DIAGNOSIS — D0512 Intraductal carcinoma in situ of left breast: Secondary | ICD-10-CM

## 2017-03-11 NOTE — Telephone Encounter (Signed)
Shannon Allen works w/Radilogist at [employer] to report pt's Breast Biopsy results from Wed. Results show Atypia & recommends that she have an excisional biopsy. [place] marked worksheet that he likes for Radiologist to contact the paitent, but he would take care of surgical referrals. Dr. Theda Sers has spoken to the patient this a.m. Shannon Allen requesting [phone]

## 2017-03-11 NOTE — Telephone Encounter (Signed)
Referral Dr Vergia Alberts due to abnormal stereotactic needle biopsy of left breast at [facility]/breast center; in need of surgical referral.

## 2017-03-16 ENCOUNTER — Ambulatory Visit: Payer: BLUE CROSS/BLUE SHIELD | Admitting: General Surgery

## 2017-03-16 ENCOUNTER — Inpatient Hospital Stay: Payer: Self-pay

## 2017-03-16 ENCOUNTER — Encounter: Payer: Self-pay | Admitting: General Surgery

## 2017-03-16 ENCOUNTER — Telehealth: Payer: Self-pay | Admitting: *Deleted

## 2017-03-16 VITALS — BP 144/82 | HR 94 | Resp 12 | Ht 62.0 in | Wt 237.0 lb

## 2017-03-16 DIAGNOSIS — N6092 Unspecified benign mammary dysplasia of left breast: Secondary | ICD-10-CM

## 2017-03-16 NOTE — Patient Instructions (Addendum)
The patient is aware to call back for any questions or new concerns.  Excision of the area with Outpatient Day Surgery

## 2017-03-16 NOTE — Progress Notes (Signed)
Patient ID: Shannon Allen, female   DOB: 1975-09-14, 42 y.o.   MRN: [id number]  Chief Complaint  Patient presents with  . Breast Problem    HPI Shannon Allen is a 42 y.o. female.  who presents for a breast evaluation. The most recent mammogram was done on 02-08-17. Left breast biopsy 03-09-17 showing atypical intraductal proliferation. Patient does perform regular self breast checks and gets regular mammograms done.   Denies any breast injury or trauma. She couldn't feel anything different in the breast prior to the mammogram. 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  HPI  Past Medical History:  Diagnosis Date  . ADHD (attention deficit hyperactivity disorder)   . Depression   . Endometriosis determined by laparoscopy     Past Surgical History:  Procedure Laterality Date  . BREAST BIOPSY Left 03/09/2017   left breast stereo path ATYPICAL INTRADUCTAL PROLIFERATION   . laproscopic surgery    . TONSILLECTOMY      Family History  Problem Relation Age of Onset  . Cancer Mother 31       breast  . Hyperlipidemia Mother   . Kidney disease Brother   . Hypertension Brother   . Hyperlipidemia Brother   . Alzheimer's disease Maternal Grandmother   . Heart disease Maternal Grandfather   . Cancer Paternal Grandfather        lung  . Colon cancer Neg Hx     Social History Social History   Tobacco Use  . Smoking status: Never Smoker  . Smokeless tobacco: Never Used  Substance Use Topics  . Alcohol use: Yes    Alcohol/week: 0.0 oz    Comment: on occasion  . Drug use: No    Allergies  Allergen Reactions  . Erythromycin Anaphylaxis    shock    Current Outpatient Medications  Medication Sig Dispense Refill  . ALPRAZolam (XANAX) 0.5 MG tablet Take 1 tablet (0.5 mg total) by mouth 2 (two) times daily as needed for anxiety. 5 tablet 0  . cetirizine (ZYRTEC) 10 MG tablet Take 10 mg by mouth daily.    . diclofenac (CATAFLAM) 50 MG tablet Take 1 tablet (50 mg  total) by mouth 2 (two) times daily. (Patient taking differently: Take 50 mg by mouth as needed. ) 60 tablet 6  . fluticasone (FLONASE) 50 MCG/ACT nasal spray Place 2 sprays into both nostrils as needed.     . Levonorgestrel-Ethinyl Estradiol (AMETHIA,CAMRESE) 0.15-0.03 &0.01 MG tablet Take 1 tablet by mouth daily. 91 tablet 3  . phentermine (ADIPEX-P) 37.5 MG tablet Take 1 tablet (37.5 mg total) by mouth daily before breakfast. 30 tablet 1   No current facility-administered medications for this visit.     Review of Systems Review of Systems  Constitutional: Negative.   Respiratory: Negative.   Cardiovascular: Negative.     Blood pressure (!) 144/82, pulse 94, resp. rate 12, height 5\' 2"  (1.575 m), weight 237 lb (107.5 kg).  Physical Exam Physical Exam  Constitutional: She is oriented to person, place, and time. She appears well-developed and well-nourished.  HENT:  Mouth/Throat: Oropharynx is clear and moist.  Eyes: Conjunctivae are normal. No scleral icterus.  Neck: Neck supple.  Cardiovascular: Normal rate, regular rhythm and normal heart sounds.  Pulmonary/Chest: Effort normal and breath sounds normal. Right breast exhibits no inverted nipple, no mass, no nipple discharge, no skin change and no tenderness. Left breast exhibits no inverted nipple, no mass, no nipple discharge, no skin change and no tenderness.  Bruising lower outer quadrant and mild irritation from dressing left breast. Left breast > right breast.   Lymphadenopathy:    She has no cervical adenopathy.    She has no axillary adenopathy.  Neurological: She is alert and oriented to person, place, and time.  Skin: Skin is warm and dry.  Psychiatric: Her behavior is normal.    Data Reviewed Screening mammograms dated January 21, 2017 showed a new area of microcalcifications in the lower outer quadrant of the left breast.  Additional views obtained February 15, 2017 BI-RADS-4.  Subsequent biopsy 6 weeks after  her initial imaging study showed atypical ductal hyperplasia.  Biopsy clip is in the lower outer quadrant of the left breast.  BI-RADS-4.  Review of the post biopsy mammogram shows essentially all of the microcalcifications in the field of the concern have been removed.  Ultrasound examination of the breast was undertaken to determine if preoperative wire localization would be required.  At the 5 o'clock position approximately 6 cm from the nipple an area of architectural distortion with cystic and solid lesions consistent with the biopsy site is evident extending to within 5 mm of the skin and extending down approximately 1.5 cm.  The overall cavity volume is 0.89 x 1.06 x 1.11 cm.  Pathology from March 09, 2017: DIAGNOSIS:  A. LEFT BREAST, LOWER OUTER QUADRANT; STEREOTACTIC BIOPSY:  - ATYPICAL INTRADUCTAL PROLIFERATION ASSOCIATED WITH CALCIFICATION AND  FOCI OF MUCOCELE-LIKE CHANGES.   Assessment    Atypical ductal hyperplasia of the left breast.    Plan    Indications wide excision to confirm no upstaging to DCIS was reviewed.  If excision is done in the near term, wire localization will not be required, if it is deferred more than a week or so she will benefit from wire localization as the biopsy cavity changes will disappear over time.    Discussed excision of the area  HPI, Physical Exam, Assessment and Plan have been scribed under the direction and in the presence of [name], MD. [name], RN  I have completed the exam and reviewed the above documentation for accuracy and completeness.  I agree with the above.  [name] has been used and any errors in dictation or transcription are unintentional.  [name], M.D., F.A.C.S. [name] [name] 03/17/2017, 8:29 AM  Patient's surgery has been scheduled for 03-25-17 at [facility].   [name], CMA

## 2017-03-16 NOTE — Telephone Encounter (Signed)
Patient contacted the office after she left appointment today and states that she has 3 appointments on 03-25-17 and cannot do surgery that day.   The patient wishes to reschedule to 04-01-17. She is aware to call on 03-31-17 for surgery arrival time.   [facility] has been notified of date change.

## 2017-03-17 ENCOUNTER — Other Ambulatory Visit: Payer: Self-pay | Admitting: *Deleted

## 2017-03-17 ENCOUNTER — Other Ambulatory Visit: Payer: Self-pay | Admitting: General Surgery

## 2017-03-17 DIAGNOSIS — N6092 Unspecified benign mammary dysplasia of left breast: Secondary | ICD-10-CM | POA: Insufficient documentation

## 2017-03-24 ENCOUNTER — Inpatient Hospital Stay: Admission: RE | Admit: 2017-03-24 | Payer: Self-pay | Source: Ambulatory Visit

## 2017-03-24 ENCOUNTER — Encounter: Payer: Self-pay | Admitting: General Surgery

## 2017-03-25 ENCOUNTER — Encounter
Admission: RE | Admit: 2017-03-25 | Discharge: 2017-03-25 | Disposition: A | Discharge status: Home / Self Care | Payer: BLUE CROSS/BLUE SHIELD | Source: Ambulatory Visit | Attending: General Surgery | Admitting: General Surgery

## 2017-03-25 ENCOUNTER — Other Ambulatory Visit: Payer: Self-pay

## 2017-03-25 HISTORY — DX: Gastro-esophageal reflux disease without esophagitis: K21.9

## 2017-03-25 HISTORY — DX: Anxiety disorder, unspecified: F41.9

## 2017-03-25 HISTORY — DX: Headache: R51

## 2017-03-25 HISTORY — DX: Personal history of urinary calculi: Z87.442

## 2017-03-25 HISTORY — DX: Headache, unspecified: R51.9

## 2017-03-25 NOTE — Patient Instructions (Signed)
Your procedure is scheduled on: Friday, April 01, 2017  Report to [facility] [facility] PERFORMS A URINE PREGNANCY TEST, THEY WILL GET YOU TO [place] 7:45  Remember: Instructions that are not followed completely may result in serious medical risk, up to and including death, or upon the discretion of your surgeon and anesthesiologist your surgery may need to be rescheduled.     _X__ 1. Do not eat food after midnight the night before your procedure.                 No gum chewing or hard candies. You may drink clear liquids up to 2 hours                 before you are scheduled to arrive for your surgery- DO not drink clear                 liquids within 2 hours of the start of your surgery.  YOU MAY DRINK WATER UP UNTIL 5 AM                  Clear Liquids include:  water, apple juice without pulp, clear carbohydrate                 drink such as Clearfast of [place]art[place], Black Coffee or Tea (Do not add                 anything to coffee or tea).     _X__ 2.  No Alcohol for 24 hours before or after surgery.   _X__ 3.  Do Not Smoke or use e-cigarettes For 24 Hours Prior to Your Surgery.                 Do not use any chewable tobacco products for at least 6 hours prior to                 surgery.  ____  4.  Bring all medications with you on the day of surgery if instructed.   ____  5.  Notify your doctor if there is any change in your medical condition      (cold, fever, infections).     Do not wear jewelry, make-up, hairpins, clips or nail polish. Do not wear lotions, powders, or perfumes. You may wear deodorant. Do not shave 48 hours prior to surgery. Men may shave face and neck. Do not bring valuables to the hospital.    [facility] is not responsible for any belongings or valuables.  Contacts, dentures or bridgework may not be worn into surgery. Leave your suitcase in the car. After  surgery it may be brought to your room. For patients admitted to the hospital, discharge time is determined by your treatment team.   Patients discharged the day of surgery will not be allowed to drive home.   Please read over the following fact sheets that you were given:   [place] PHONE   __X__ Take these medicines the morning of surgery with A SIP OF WATER:    1.XANAX  2. FLONASE IF YOU NEED IT  3. ZANTAC, TAKE THE NIGHT BEFORE SURGERY AND THE MORNING OF SURGERY  4.  5.  6.  ____ Fleet Enema (as directed)   _X___ Use  ANTIBACTERIAL SOAP THE NIGHT BEFORE AND THE MORNING OF SURGERY  ____ Use inhalers on the day of surgery  _X___ Stop ASPIRIN PRODUCTS AS OF TODAY, March 25, 2017  __X__ Stop Anti-inflammatories AS OF TODAY, March 25, 2017.                    THIS INCLUDES IBUPROFEN / MOTRIN / ADVIL / ALEVE / NAPROSYN / GOODIES POWDERS / CATAFLAM                                          YOU MAY TAKE TYLENOL FOR YOUR DISCOMFORTS!   ____ Stop supplements until after surgery.    ____ Bring C-Pap to the hospital.   BRING A SPORTS BRA THAT OPENS IN THE FRONT ON THE DAY OF SURGERY.  MAKE SURE TO WEAR COMFORTABLE CLOTHING, LOOSE SHIRT  CONTINUE TO TAKE YOUR BIRTH CONTROL AND PHENTERMINE AS SCHEDULED.DO NOT TAKE ON THE DAY OF SURGERY.

## 2017-04-01 ENCOUNTER — Ambulatory Visit
Admission: RE | Admit: 2017-04-01 | Discharge: 2017-04-01 | Disposition: A | Discharge status: Home / Self Care | Payer: BLUE CROSS/BLUE SHIELD | Source: Ambulatory Visit | Attending: General Surgery | Admitting: General Surgery

## 2017-04-01 ENCOUNTER — Encounter
Admission: RE | Disposition: A | Discharge status: Home / Self Care | Payer: Self-pay | Source: Ambulatory Visit | Attending: General Surgery

## 2017-04-01 ENCOUNTER — Ambulatory Visit: Payer: BLUE CROSS/BLUE SHIELD | Admitting: Registered Nurse

## 2017-04-01 DIAGNOSIS — K219 Gastro-esophageal reflux disease without esophagitis: Secondary | ICD-10-CM | POA: Insufficient documentation

## 2017-04-01 DIAGNOSIS — N6092 Unspecified benign mammary dysplasia of left breast: Secondary | ICD-10-CM

## 2017-04-01 DIAGNOSIS — F419 Anxiety disorder, unspecified: Secondary | ICD-10-CM | POA: Diagnosis not present

## 2017-04-01 DIAGNOSIS — F329 Major depressive disorder, single episode, unspecified: Secondary | ICD-10-CM | POA: Insufficient documentation

## 2017-04-01 DIAGNOSIS — N62 Hypertrophy of breast: Secondary | ICD-10-CM | POA: Diagnosis present

## 2017-04-01 DIAGNOSIS — F909 Attention-deficit hyperactivity disorder, unspecified type: Secondary | ICD-10-CM | POA: Insufficient documentation

## 2017-04-01 DIAGNOSIS — Z79899 Other long term (current) drug therapy: Secondary | ICD-10-CM | POA: Diagnosis not present

## 2017-04-01 DIAGNOSIS — E669 Obesity, unspecified: Secondary | ICD-10-CM | POA: Insufficient documentation

## 2017-04-01 DIAGNOSIS — Z803 Family history of malignant neoplasm of breast: Secondary | ICD-10-CM | POA: Insufficient documentation

## 2017-04-01 DIAGNOSIS — D0512 Intraductal carcinoma in situ of left breast: Secondary | ICD-10-CM | POA: Insufficient documentation

## 2017-04-01 DIAGNOSIS — D493 Neoplasm of unspecified behavior of breast: Secondary | ICD-10-CM | POA: Diagnosis not present

## 2017-04-01 DIAGNOSIS — Z881 Allergy status to other antibiotic agents status: Secondary | ICD-10-CM | POA: Diagnosis not present

## 2017-04-01 HISTORY — PX: BREAST EXCISIONAL BIOPSY: SUR124

## 2017-04-01 HISTORY — DX: Malignant neoplasm of unspecified site of unspecified female breast: C50.919

## 2017-04-01 HISTORY — PX: BREAST BIOPSY: SHX20

## 2017-04-01 LAB — POCT PREGNANCY, URINE: PREG TEST UR: NEGATIVE

## 2017-04-01 SURGERY — BREAST BIOPSY WITH NEEDLE LOCALIZATION
Anesthesia: General | Laterality: Left | Wound class: Clean

## 2017-04-01 MED ORDER — KETOROLAC TROMETHAMINE 30 MG/ML IJ SOLN
INTRAMUSCULAR | Status: AC
  Filled 2017-04-01: qty 1

## 2017-04-01 MED ORDER — DEXAMETHASONE SODIUM PHOSPHATE 10 MG/ML IJ SOLN
INTRAMUSCULAR | Status: DC | PRN
  Administered 2017-04-01: 5 mg via INTRAVENOUS

## 2017-04-01 MED ORDER — PROPOFOL 10 MG/ML IV BOLUS
INTRAVENOUS | Status: DC | PRN
  Administered 2017-04-01: 150 mg via INTRAVENOUS

## 2017-04-01 MED ORDER — PROMETHAZINE HCL 25 MG/ML IJ SOLN: 6.2500 mg | INTRAMUSCULAR | Status: DC | PRN

## 2017-04-01 MED ORDER — ACETAMINOPHEN 10 MG/ML IV SOLN
INTRAVENOUS | Status: DC | PRN
  Administered 2017-04-01: 1000 mg via INTRAVENOUS

## 2017-04-01 MED ORDER — KETOROLAC TROMETHAMINE 30 MG/ML IJ SOLN
INTRAMUSCULAR | Status: DC | PRN
  Administered 2017-04-01: 30 mg via INTRAVENOUS

## 2017-04-01 MED ORDER — LACTATED RINGERS IV SOLN
INTRAVENOUS | Status: DC
  Administered 2017-04-01: 10:00:00 via INTRAVENOUS

## 2017-04-01 MED ORDER — HYDROCODONE-ACETAMINOPHEN 5-325 MG PO TABS: 1.0000 | ORAL_TABLET | ORAL | 0 refills | Status: AC | PRN

## 2017-04-01 MED ORDER — OXYCODONE HCL 5 MG PO TABS: 5.0000 mg | ORAL_TABLET | Freq: Once | ORAL | Status: DC | PRN

## 2017-04-01 MED ORDER — FENTANYL CITRATE (PF) 100 MCG/2ML IJ SOLN
25.0000 ug | INTRAMUSCULAR | Status: DC | PRN
  Administered 2017-04-01 (×4): 25 ug via INTRAVENOUS

## 2017-04-01 MED ORDER — MIDAZOLAM HCL 2 MG/2ML IJ SOLN
INTRAMUSCULAR | Status: AC
  Filled 2017-04-01: qty 2

## 2017-04-01 MED ORDER — MIDAZOLAM HCL 2 MG/2ML IJ SOLN
INTRAMUSCULAR | Status: DC | PRN
  Administered 2017-04-01: 2 mg via INTRAVENOUS

## 2017-04-01 MED ORDER — DEXAMETHASONE SODIUM PHOSPHATE 10 MG/ML IJ SOLN
INTRAMUSCULAR | Status: AC
  Filled 2017-04-01: qty 1

## 2017-04-01 MED ORDER — LIDOCAINE HCL (CARDIAC) 20 MG/ML IV SOLN
INTRAVENOUS | Status: DC | PRN
  Administered 2017-04-01: 100 mg via INTRAVENOUS

## 2017-04-01 MED ORDER — PROPOFOL 10 MG/ML IV BOLUS
INTRAVENOUS | Status: AC
  Filled 2017-04-01: qty 20

## 2017-04-01 MED ORDER — FENTANYL CITRATE (PF) 100 MCG/2ML IJ SOLN
INTRAMUSCULAR | Status: DC | PRN
  Administered 2017-04-01 (×4): 25 ug via INTRAVENOUS

## 2017-04-01 MED ORDER — GLYCOPYRROLATE 0.2 MG/ML IJ SOLN
INTRAMUSCULAR | Status: DC | PRN
  Administered 2017-04-01: 0.2 mg via INTRAVENOUS

## 2017-04-01 MED ORDER — ONDANSETRON HCL 4 MG/2ML IJ SOLN
INTRAMUSCULAR | Status: AC
  Filled 2017-04-01: qty 2

## 2017-04-01 MED ORDER — FENTANYL CITRATE (PF) 100 MCG/2ML IJ SOLN
INTRAMUSCULAR | Status: AC
  Administered 2017-04-01: 25 ug via INTRAVENOUS
  Filled 2017-04-01: qty 2

## 2017-04-01 MED ORDER — ACETAMINOPHEN 10 MG/ML IV SOLN
INTRAVENOUS | Status: AC
  Filled 2017-04-01: qty 100

## 2017-04-01 MED ORDER — MEPERIDINE HCL 50 MG/ML IJ SOLN: 6.2500 mg | INTRAMUSCULAR | Status: DC | PRN

## 2017-04-01 MED ORDER — BUPIVACAINE-EPINEPHRINE (PF) 0.5% -1:200000 IJ SOLN
INTRAMUSCULAR | Status: AC
  Filled 2017-04-01: qty 30

## 2017-04-01 MED ORDER — OXYCODONE HCL 5 MG/5ML PO SOLN: 5.0000 mg | Freq: Once | ORAL | Status: DC | PRN

## 2017-04-01 MED ORDER — GLYCOPYRROLATE 0.2 MG/ML IJ SOLN
INTRAMUSCULAR | Status: AC
  Filled 2017-04-01: qty 1

## 2017-04-01 MED ORDER — FENTANYL CITRATE (PF) 100 MCG/2ML IJ SOLN
INTRAMUSCULAR | Status: AC
  Filled 2017-04-01: qty 4

## 2017-04-01 MED ORDER — LIDOCAINE HCL (PF) 2 % IJ SOLN
INTRAMUSCULAR | Status: AC
  Filled 2017-04-01: qty 10

## 2017-04-01 MED ORDER — BUPIVACAINE-EPINEPHRINE 0.5% -1:200000 IJ SOLN
INTRAMUSCULAR | Status: DC | PRN
  Administered 2017-04-01: 30 mL

## 2017-04-01 MED ORDER — ONDANSETRON HCL 4 MG/2ML IJ SOLN
INTRAMUSCULAR | Status: DC | PRN
  Administered 2017-04-01: 4 mg via INTRAVENOUS

## 2017-04-01 SURGICAL SUPPLY — 41 items
BANDAGE ELASTIC 6 LF NS (GAUZE/BANDAGES/DRESSINGS) ×2 IMPLANT
BLADE SURG 15 STRL SS SAFETY (BLADE) ×4 IMPLANT
BNDG CMPR MED 5X6 ELC HKLP NS (GAUZE/BANDAGES/DRESSINGS) ×1
BNDG GAUZE 4.5X4.1 6PLY STRL (MISCELLANEOUS) ×4 IMPLANT
CANISTER SUCT 1200ML W/VALVE (MISCELLANEOUS) ×2 IMPLANT
CHLORAPREP W/TINT 26ML (MISCELLANEOUS) ×4 IMPLANT
CNTNR SPEC 2.5X3XGRAD LEK (MISCELLANEOUS) ×1
CONT SPEC 4OZ STER OR WHT (MISCELLANEOUS) ×1
CONT SPEC 4OZ STRL OR WHT (MISCELLANEOUS) ×1
CONTAINER SPEC 2.5X3XGRAD LEK (MISCELLANEOUS) ×1 IMPLANT
COVER PROBE FLX POLY STRL (MISCELLANEOUS) ×2 IMPLANT
DEVICE DUBIN SPECIMEN MAMMOGRA (MISCELLANEOUS) ×2 IMPLANT
DRAPE CHEST BREAST 77X106 FENE (MISCELLANEOUS) ×2 IMPLANT
DRAPE LAPAROTOMY 100X77 ABD (DRAPES) ×2 IMPLANT
DRSG TELFA 4X3 1S NADH ST (GAUZE/BANDAGES/DRESSINGS) ×4 IMPLANT
ELECT CAUTERY BLADE TIP 2.5 (TIP) ×2
ELECT REM PT RETURN 9FT ADLT (ELECTROSURGICAL) ×2
ELECTRODE CAUTERY BLDE TIP 2.5 (TIP) ×1 IMPLANT
ELECTRODE REM PT RTRN 9FT ADLT (ELECTROSURGICAL) ×1 IMPLANT
GAUZE FLUFF 18X24 1PLY STRL (GAUZE/BANDAGES/DRESSINGS) ×2 IMPLANT
GLOVE BIO SURGEON STRL SZ7.5 (GLOVE) ×2 IMPLANT
GLOVE INDICATOR 8.0 STRL GRN (GLOVE) ×2 IMPLANT
GOWN STRL REUS W/ TWL LRG LVL3 (GOWN DISPOSABLE) ×2 IMPLANT
GOWN STRL REUS W/TWL LRG LVL3 (GOWN DISPOSABLE) ×4
KIT RM TURNOVER STRD PROC AR (KITS) ×2 IMPLANT
LABEL OR SOLS (LABEL) ×2 IMPLANT
MARGIN MAP 10MM (MISCELLANEOUS) ×2 IMPLANT
NDL HYPO 25X1 1.5 SAFETY (NEEDLE) ×1 IMPLANT
NEEDLE HYPO 22GX1.5 SAFETY (NEEDLE) ×2 IMPLANT
NEEDLE HYPO 25X1 1.5 SAFETY (NEEDLE) ×2 IMPLANT
PACK BASIN MINOR ARMC (MISCELLANEOUS) ×2 IMPLANT
STRIP CLOSURE SKIN 1/2X4 (GAUZE/BANDAGES/DRESSINGS) ×4 IMPLANT
SUT ETHILON 3-0 FS-10 30 BLK (SUTURE) ×2
SUT VIC AB 2-0 CT1 27 (SUTURE) ×2
SUT VIC AB 2-0 CT1 TAPERPNT 27 (SUTURE) ×1 IMPLANT
SUT VIC AB 4-0 FS2 27 (SUTURE) ×2 IMPLANT
SUTURE EHLN 3-0 FS-10 30 BLK (SUTURE) ×1 IMPLANT
SWABSTK COMLB BENZOIN TINCTURE (MISCELLANEOUS) ×4 IMPLANT
SYR CONTROL 10ML (SYRINGE) ×2 IMPLANT
TAPE TRANSPORE STRL 2 31045 (GAUZE/BANDAGES/DRESSINGS) ×2 IMPLANT
WATER STERILE IRR 1000ML POUR (IV SOLUTION) ×2 IMPLANT

## 2017-04-01 NOTE — H&P (Signed)
No change in clinical history or exam.  

## 2017-04-01 NOTE — Anesthesia Procedure Notes (Signed)
Procedure Name: LMA Insertion Date/Time: 04/01/2017 10:11 AM Performed by: Doreen Salvage, CRNA Pre-anesthesia Checklist: Patient identified, Patient being monitored, Timeout performed, Emergency Drugs available and Suction available Patient Re-evaluated:Patient Re-evaluated prior to induction Oxygen Delivery Method: Circle system utilized Preoxygenation: Pre-oxygenation with 100% oxygen Induction Type: IV induction Ventilation: Mask ventilation without difficulty LMA: LMA inserted LMA Size: 5.0 Tube type: Oral Number of attempts: 1 Placement Confirmation: positive ETCO2 and breath sounds checked- equal and bilateral Tube secured with: Tape Dental Injury: Teeth and Oropharynx as per pre-operative assessment

## 2017-04-01 NOTE — OR Nursing (Signed)
Per [place] (in [place], per Dr. Bary Castilla) ok to d/c pt to home without his visit.

## 2017-04-01 NOTE — Anesthesia Post-op Follow-up Note (Signed)
Anesthesia QCDR form completed.        

## 2017-04-01 NOTE — Anesthesia Postprocedure Evaluation (Signed)
Anesthesia Post Note  Patient: Shannon Allen  Procedure(s) Performed: BREAST BIOPSY WITH NEEDLE LOCALIZATION (Left )  Patient location during evaluation: PACU Anesthesia Type: General Level of consciousness: awake and alert and oriented Pain management: pain level controlled Vital Signs Assessment: post-procedure vital signs reviewed and stable Respiratory status: spontaneous breathing, nonlabored ventilation and respiratory function stable Cardiovascular status: blood pressure returned to baseline and stable Postop Assessment: no signs of nausea or vomiting Anesthetic complications: no     Last Vitals:  Vitals:   04/01/17 1204 04/01/17 1240  BP: (!) 142/96 129/73  Pulse: 92 83  Resp: 16   Temp: 36.8 C (!) 36.3 C  SpO2: 98% 98%    Last Pain:  Vitals:   04/01/17 1240  TempSrc: Temporal  PainSc:                  Darina Hartwell

## 2017-04-01 NOTE — Discharge Instructions (Signed)

## 2017-04-01 NOTE — Op Note (Signed)
Preoperative diagnosis: Atypical ductal hyperplasia of the left breast.  Postoperative diagnosis: Same.  Operative procedure: Left breast wire and ultrasound-guided biopsy.  Operating Surgeon: [name], MD.  Anesthesia: General by LMA.  Marcaine 0.5% with 1-200,000 units of epinephrine, 30 cc.  Estimated blood loss: Less than 5 cc.  Clinical note: This 42 year old woman had a stereo biopsy with evidence of ADH.  She is brought to the operating for planned reexcision.  Operative note: The patient underwent general anesthesia.  The breast was taped medially to provide better exposure of the inferior aspect.  ChloraPrep was applied.  Ultrasound was used to identify the tip of the wire in the retroareolar tissue just below the edge of the areola at the 6 o'clock position.  A vertical incision at the 6 o'clock position was chosen.  Local anesthetic was infiltrated.  The skin was incised sharply and the remaining dissection completed with electrocautery.  The wire was brought into the operative field.  A 3 x 4 x 4 cm block of tissue extending posteriorly to include a second small cluster of calcifications noted on the wire localization images was removed, orientated and specimen radiograph confirmed the previously placed clip, wire tip and additional calcifications were in the specimen.  This was then sent to pathology for routine evaluation with margins having been indicated with metal markers.  The breast tissue was approximated with 2-0 Vicryl figure-of-eight sutures in multiple layers.  The skin was closed with a running 4-0 Vicryl subarticular suture.  Benzoin, Steri-Strips, and Telfa dressing applied followed by fluff gauze and a surgical bra.  Patient tolerated the procedure well and was brought to the recovery room in stable condition.

## 2017-04-01 NOTE — Anesthesia Preprocedure Evaluation (Signed)
Anesthesia Evaluation  Patient identified by MRN, date of birth, ID band Patient awake    Reviewed: Allergy & Precautions, NPO status , Patient's Chart, lab work & pertinent test results  History of Anesthesia Complications Negative for: history of anesthetic complications  Airway Mallampati: III  TM Distance: >3 FB Neck ROM: Full    Dental no notable dental hx.    Pulmonary neg sleep apnea, neg COPD,    breath sounds clear to auscultation- rhonchi (-) wheezing      Cardiovascular Exercise Tolerance: Good (-) hypertension(-) CAD, (-) Past MI, (-) Cardiac Stents and (-) CABG  Rhythm:Regular Rate:Normal - Systolic murmurs and - Diastolic murmurs    Neuro/Psych  Headaches, PSYCHIATRIC DISORDERS Anxiety Depression    GI/Hepatic Neg liver ROS, GERD  ,  Endo/Other  negative endocrine ROSneg diabetes  Renal/GU negative Renal ROS     Musculoskeletal negative musculoskeletal ROS (+)   Abdominal (+) + obese,   Peds  Hematology negative hematology ROS (+)   Anesthesia Other Findings Past Medical History: No date: ADHD (attention deficit hyperactivity disorder) No date: Anxiety     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 03/2017: Breast cancer ([place]) No date: Depression 2005: Endometriosis determined by laparoscopy No date: GERD (gastroesophageal reflux disease) No date: Headache No date: History of kidney stones   Reproductive/Obstetrics                             Anesthesia Physical Anesthesia Plan  ASA: II  Anesthesia Plan: General   Post-op Pain Management:    Induction: Intravenous  PONV Risk Score and Plan: 2 and Ondansetron, Dexamethasone and Midazolam  Airway Management Planned: LMA  Additional Equipment:   Intra-op Plan:   Post-operative Plan:   Informed Consent: I have reviewed the patients History and Physical, chart, labs and discussed the procedure including the risks, benefits and  alternatives for the proposed anesthesia with the patient or authorized representative who has indicated his/her understanding and acceptance.   Dental advisory given  Plan Discussed with: CRNA and Anesthesiologist  Anesthesia Plan Comments:         Anesthesia Quick Evaluation

## 2017-04-01 NOTE — Transfer of Care (Signed)
Immediate Anesthesia Transfer of Care Note  Patient: Shannon Allen  Procedure(s) Performed: Procedure(s): BREAST BIOPSY WITH NEEDLE LOCALIZATION (Left)  Patient Location: PACU  Anesthesia Type:General  Level of Consciousness: sedated  Airway & Oxygen Therapy: Patient Spontanous Breathing and Patient connected to face mask oxygen  Post-op Assessment: Report given to RN and Post -op Vital signs reviewed and stable  Post vital signs: Reviewed and stable  Last Vitals:  Vitals:   04/01/17 0909 04/01/17 1117  BP: (!) 136/91 (!) 139/95  Pulse: 87 94  Resp: 17 15  Temp: (!) 35.8 C 36.9 C  SpO2: 86% 381%    Complications: No apparent anesthesia complications

## 2017-04-03 ENCOUNTER — Encounter: Payer: Self-pay | Admitting: General Surgery

## 2017-04-05 ENCOUNTER — Telehealth: Payer: Self-pay | Admitting: General Surgery

## 2017-04-05 NOTE — Telephone Encounter (Signed)
The patient was notified that the recently completed breast excision showed low-grade DCIS.  She reports that the soreness is improving with ibuprofen.  She remove the surgical bra  for showering yesterday, is wearing a bra day and night for support.  Will review margin status at follow-up.  If ER PR positive, and a candidate for accelerated partial breast radiation, would likely not recommend reexcision.

## 2017-04-06 LAB — SURGICAL PATHOLOGY

## 2017-04-12 ENCOUNTER — Encounter: Payer: Self-pay | Admitting: *Deleted

## 2017-04-12 ENCOUNTER — Encounter: Payer: Self-pay | Admitting: General Surgery

## 2017-04-12 ENCOUNTER — Ambulatory Visit (INDEPENDENT_AMBULATORY_CARE_PROVIDER_SITE_OTHER): Payer: BLUE CROSS/BLUE SHIELD | Admitting: General Surgery

## 2017-04-12 VITALS — BP 138/82 | HR 85 | Resp 14 | Ht 66.0 in | Wt 239.0 lb

## 2017-04-12 DIAGNOSIS — D0512 Intraductal carcinoma in situ of left breast: Secondary | ICD-10-CM

## 2017-04-12 DIAGNOSIS — N6092 Unspecified benign mammary dysplasia of left breast: Secondary | ICD-10-CM

## 2017-04-12 NOTE — Progress Notes (Signed)
Patient ID: Shannon Allen, female   DOB: Feb 13, 1976, 42 y.o.   MRN: [id number]  Chief Complaint  Patient presents with  . Routine Post Op    HPI Shannon Allen is a 42 y.o. female here today for her post op left breast biopsy/lumpectomy done on 04/01/2017. Patient states she is doing well, just getting over a cold.    Marland KitchenHPI  Past Medical History:  Diagnosis Date  . ADHD (attention deficit hyperactivity disorder)   . 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 Breast cancer ([place]) 03/2017  . Depression   . Endometriosis determined by laparoscopy 2005  . GERD (gastroesophageal reflux disease)   . Headache   . History of kidney stones     Past Surgical History:  Procedure Laterality Date  . BREAST BIOPSY Left 03/09/2017   left breast stereo path ATYPICAL INTRADUCTAL PROLIFERATION   . BREAST BIOPSY Left 04/01/2017   Procedure: BREAST BIOPSY WITH NEEDLE LOCALIZATION;  Surgeon: [name], MD;  Location: [facility] ORS;  Service: General;  Laterality: Left;  . BREAST EXCISIONAL BIOPSY Left 04/01/2017   lumpectomy done by Dr. Bary Castilla  . laproscopic surgery  2005  . TONSILLECTOMY      Family History  Problem Relation Age of Onset  . Cancer Mother 69       breast  . Hyperlipidemia Mother   . Kidney disease Brother   . Hypertension Brother   . Hyperlipidemia Brother   . Alzheimer's disease Maternal Grandmother   . Heart disease Maternal Grandfather   . Cancer Paternal Grandfather        lung  . Colon cancer Neg Hx     Social History Social History   Tobacco Use  . Smoking status: Never Smoker  . Smokeless tobacco: Never Used  Substance Use Topics  . Alcohol use: Yes    Alcohol/week: 0.0 oz    Comment: on occasion  . Drug use: No    Allergies  Allergen Reactions  . Erythromycin Anaphylaxis, Shortness Of Breath and Other (See Comments)    shock  . Adhesive [Tape]     Prefers paper tape    Current Outpatient Medications  Medication Sig Dispense Refill  . ALPRAZolam  (XANAX) 0.5 MG tablet Take 1 tablet (0.5 mg total) by mouth 2 (two) times daily as needed for anxiety. 5 tablet 0  . cetirizine (ZYRTEC) 10 MG tablet Take 10 mg by mouth at bedtime.     . diclofenac (CATAFLAM) 50 MG tablet Take 1 tablet (50 mg total) by mouth 2 (two) times daily. (Patient taking differently: Take 50 mg by mouth 2 (two) times daily as needed (for pain/cramps). ) 60 tablet 6  . fluticasone (FLONASE) 50 MCG/ACT nasal spray Place 2 sprays into both nostrils daily as needed (for allergies.).     Marland Kitchen HYDROcodone-acetaminophen (NORCO/VICODIN) 5-325 MG tablet Take 1 tablet by mouth every 4 (four) hours as needed for moderate pain. 15 tablet 0  . ibuprofen (ADVIL,MOTRIN) 200 MG tablet Take 400-600 mg by mouth every 8 (eight) hours as needed (for pain.).    Marland Kitchen Levonorgestrel-Ethinyl Estradiol (AMETHIA,CAMRESE) 0.15-0.03 &0.01 MG tablet Take 1 tablet by mouth daily. (Patient taking differently: Take 1 tablet by mouth at bedtime. ) 91 tablet 3  . phentermine (ADIPEX-P) 37.5 MG tablet Take 1 tablet (37.5 mg total) by mouth daily before breakfast. 30 tablet 1   No current facility-administered medications for this visit.     Review of Systems Review of Systems  Constitutional:  Negative.   Respiratory: Negative.   Cardiovascular: Negative.     Blood pressure 138/82, pulse 85, resp. rate 14, height '5\' 6"'  (1.676 m), weight 239 lb (108.4 kg).  Physical Exam Physical Exam  Constitutional: She is oriented to person, place, and time. She appears well-developed and well-nourished.  Cardiovascular: Normal rate, regular rhythm and normal heart sounds.  Pulmonary/Chest: Effort normal and breath sounds normal.    Neurological: She is alert and oriented to person, place, and time.  Skin: Skin is warm and dry.    Data Reviewed April 01, 2017 left breast biopsy:   A. BREAST, LEFT 6:00; NEEDLE LOCALIZED WIDE EXCISION:  - DUCTAL CARCINOMA IN SITU.  - FLAT EPITHELIAL ATYPIA.  - SEE CANCER  SUMMARY BELOW.  - CALCIFICATIONS ASSOCIATED WITH DCIS.  - FOCAL MUCOCELE-LIKE LESION.  - METALLIC CLIP WITH BIOPSY SITE CHANGE.  - NEGATIVE FOR INVASIVE CARCINOMA.   Surgical Pathology Cancer Case Summary   DUCTAL CARCINOMA IN SITU OF THE BREAST:  Procedure: Needle localized excision  Specimen Laterality: Left  Size (Extent) of DCIS: at least 24 mm  Histologic Type: Ductal carcinoma in situ (DCIS)  Nuclear Grade: Predominantly low grade with focal intermediate grade  Necrosis: Not identified  Margins: DCIS is less than 2 mm to the posterior, inferior and anterior  margins, multifocal   Assessment    Low/intermediate grade DCIS, less than 2 mm margins.    Plan  Options for management reviewed: Traditional management would include excision of additional tissue to achieve 2 mm negative margins followed by radiation therapy and antiestrogen therapy.  Opportunity to investigate the COMET trial for management with antiestrogen therapy alone was reviewed, and the patient met with Sharyne Richters, RN study coordinator.  She may not be a candidate to participate as she underwent formal excision, but if so might be a candidate for antiestrogen therapy alone.  Will place a follow-up on hold pending research coordinator investigation as to eligibility.  Patient is presently on contraceptives for suppression of endometriosis.  Would need to look and see if she could participate if she needed to be maintained on this medication while taking an antiestrogen. HPI, Physical Exam, Assessment and Plan have been scribed under the direction and in the presence of [name], MD.  Gaspar Cola, CMA   I have completed the exam and reviewed the above documentation for accuracy and completeness.  I agree with the above.  [name] has been used and any errors in dictation or transcription are unintentional.  [name], M.D., F.A.C.S.  [name] Yaseen Gilberg 04/12/2017, 11:35 AM

## 2017-04-13 ENCOUNTER — Other Ambulatory Visit: Payer: Self-pay

## 2017-04-13 DIAGNOSIS — D0512 Intraductal carcinoma in situ of left breast: Secondary | ICD-10-CM

## 2017-04-14 ENCOUNTER — Other Ambulatory Visit: Payer: Self-pay

## 2017-04-14 DIAGNOSIS — D0512 Intraductal carcinoma in situ of left breast: Secondary | ICD-10-CM | POA: Insufficient documentation

## 2017-04-14 DIAGNOSIS — D0510 Intraductal carcinoma in situ of unspecified breast: Secondary | ICD-10-CM | POA: Insufficient documentation

## 2017-04-14 NOTE — Progress Notes (Signed)
Hematology/Oncology Consult note [facility] Telephone:([phone] [phone]   Patient Care Team: Guadalupe Maple, MD as PCP - General (Family Medicine) Bary Castilla, [name], MD as Consulting Physician (General Surgery) Gae Dry, MD as Referring Physician (Obstetrics and Gynecology)  REFERRING PROVIDER:  CHIEF COMPLAINTS/PURPOSE OF CONSULTATION:  Evaluation of   HISTORY OF PRESENTING ILLNESS:  Shannon Allen is a  42 y.o.  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  # 02/15/2017 MM digital diagnostic: Additional mammographic views of the left breast demonstrate an area of indeterminate calcifications in the left breast lower slightly outer quadrant, middle to posterior depth. The group measures 2.0 x 3.6 x 1.8 cm. There is no associated mass. # US guided biopsy on 03/09/2017 showed atypical intraductal proliferation.  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   Patient is presently on contraceptives for suppression of endometriosis.  She denies any symptoms.    Review of Systems  Constitutional: Negative for chills, fever and weight loss.  HENT: Negative for ear discharge and hearing loss.   Respiratory: Negative for cough and shortness of breath.   Cardiovascular: Negative for chest pain, orthopnea and claudication.  Gastrointestinal: Negative for heartburn.  Genitourinary: Negative for dysuria.  Musculoskeletal: Negative for myalgias.  Skin: Negative for rash.  Neurological: Negative for tremors.  Endo/Heme/Allergies: Does not bruise/bleed easily.  Psychiatric/Behavioral: Negative for depression.    MEDICAL HISTORY:  Past Medical History:  Diagnosis Date  . ADHD (attention deficit hyperactivity disorder)   . 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 Breast cancer ([place]) 03/2017  . Depression   . Endometriosis  determined by laparoscopy 2005  . GERD (gastroesophageal reflux disease)   . Headache   . History of kidney stones     SURGICAL HISTORY: Past Surgical History:  Procedure Laterality Date  . BREAST BIOPSY Left 03/09/2017   left breast stereo path ATYPICAL INTRADUCTAL PROLIFERATION   . BREAST BIOPSY Left 04/01/2017   Procedure: BREAST BIOPSY WITH NEEDLE LOCALIZATION;  Surgeon: [name], MD;  Location: [facility] ORS;  Service: General;  Laterality: Left;  . BREAST EXCISIONAL BIOPSY Left 04/01/2017   lumpectomy done by Dr. Bary Castilla  . laproscopic surgery  2005  . TONSILLECTOMY      SOCIAL HISTORY: Social History   Socioeconomic History  . Marital status: Divorced    Spouse name: Not on file  . Number of children: Not on file  . Years of education: Not on file  . Highest education level: Not on file  Social Needs  . Financial resource strain: Not on file  . Food insecurity - worry: Not on file  . Food insecurity - inability: Not on file  . Transportation needs - medical: Not on file  . Transportation needs - non-medical: Not on file  Occupational History  . Not on file  Tobacco Use  . Smoking status: Never Smoker  . Smokeless tobacco: Never Used  Substance and Sexual Activity  . Alcohol use: Yes    Alcohol/week: 0.0 oz    Comment: on occasion  . Drug use: No  . Sexual activity: Not Currently  Other Topics Concern  . Not on file  Social History Narrative  . Not on file    FAMILY HISTORY: Family History  Problem Relation Age of Onset  . Cancer Mother 47  breast  . Hyperlipidemia Mother   . Kidney disease Brother   . Hypertension Brother   . Hyperlipidemia Brother   . Alzheimer's disease Maternal Grandmother   . Heart disease Maternal Grandfather   . Cancer Paternal Grandfather        lung  . Colon cancer Neg Hx     ALLERGIES:  is allergic to erythromycin and adhesive [tape].  MEDICATIONS:  Current Outpatient Medications  Medication Sig Dispense  Refill  . ALPRAZolam (XANAX) 0.5 MG tablet Take 1 tablet (0.5 mg total) by mouth 2 (two) times daily as needed for anxiety. 5 tablet 0  . cetirizine (ZYRTEC) 10 MG tablet Take 10 mg by mouth at bedtime.     . diclofenac (CATAFLAM) 50 MG tablet Take 1 tablet (50 mg total) by mouth 2 (two) times daily. (Patient taking differently: Take 50 mg by mouth 2 (two) times daily as needed (for pain/cramps). ) 60 tablet 6  . fluticasone (FLONASE) 50 MCG/ACT nasal spray Place 2 sprays into both nostrils daily as needed (for allergies.).     Marland Kitchen HYDROcodone-acetaminophen (NORCO/VICODIN) 5-325 MG tablet Take 1 tablet by mouth every 4 (four) hours as needed for moderate pain. 15 tablet 0  . ibuprofen (ADVIL,MOTRIN) 200 MG tablet Take 400-600 mg by mouth every 8 (eight) hours as needed (for pain.).    Marland Kitchen Levonorgestrel-Ethinyl Estradiol (AMETHIA,CAMRESE) 0.15-0.03 &0.01 MG tablet Take 1 tablet by mouth daily. (Patient taking differently: Take 1 tablet by mouth at bedtime. ) 91 tablet 3  . phentermine (ADIPEX-P) 37.5 MG tablet Take 1 tablet (37.5 mg total) by mouth daily before breakfast. 30 tablet 1   No current facility-administered medications for this visit.    Pathology A. BREAST, LEFT 6:00; NEEDLE LOCALIZED WIDE EXCISION:  - DUCTAL CARCINOMA IN SITU.  - FLAT EPITHELIAL ATYPIA.  - SEE CANCER SUMMARY BELOW.  - CALCIFICATIONS ASSOCIATED WITH DCIS.  - FOCAL MUCOCELE-LIKE LESION.  - METALLIC CLIP WITH BIOPSY SITE CHANGE.  - NEGATIVE FOR INVASIVE CARCINOMA.  Surgical Pathology Cancer Case Summary   DUCTAL CARCINOMA IN SITU OF THE BREAST:  Procedure: Needle localized excision  Specimen Laterality: Left  Size (Extent) of DCIS: at least 24 mm  Histologic Type: Ductal carcinoma in situ (DCIS)  Nuclear Grade: Predominantly low grade with focal intermediate grade  Necrosis: Not identified  Margins: DCIS is less than 2 mm to the posterior, inferior and anterior  margins, multifocal  Regional Lymph nodes: Lymph  nodes not submitted or found  Pathologic Stage Classification (pTNM, AJCC 8th Edition): pTis (DCIS) pNx  Breast Biomarker Reporting Template: ER and PR   BREAST BIOMARKER TESTS  Estrogen Receptor (ER) Status: Positive, greater than 90%   Average intensity of staining: Moderate  Progesterone Receptor (PgR) Status: Positive, greater than 90%   Average intensity of staining: Moderate   PHYSICAL EXAMINATION: ECOG PERFORMANCE STATUS: 0 - Asymptomatic Vitals:   04/15/17 1111  Resp: 12   Filed Weights   04/15/17 1111  Weight: 239 lb 6.4 oz (108.6 kg)    Physical Exam  Constitutional: She is oriented to person, place, and time and well-developed, well-nourished, and in no distress. No distress.  HENT:  Head: Normocephalic and atraumatic.  Mouth/Throat: No oropharyngeal exudate.  Eyes: EOM are normal. Pupils are equal, round, and reactive to light. No scleral icterus.  Neck: Normal range of motion. Neck supple.  Cardiovascular: Normal rate and regular rhythm.  No murmur heard. Pulmonary/Chest: Effort normal and breath sounds normal. No respiratory distress. She has no  wheezes.  Abdominal: Soft. Bowel sounds are normal. She exhibits no mass.  Musculoskeletal: She exhibits no edema.  Lymphadenopathy:    She has no cervical adenopathy.  Neurological: She is alert and oriented to person, place, and time.  Skin: Skin is warm and dry. No erythema.  Psychiatric: Affect normal.  Breast exam was performed in seated and lying down position. Left breast 6:00 excisional biopsy scar well healed.  No evidence of any palpable masses. No evidence of axillary adenopathy.    LABORATORY DATA:  I have reviewed the data as listed Lab Results  Component Value Date   WBC 10.8 10/18/2016   HGB 12.1 10/18/2016   HCT 37.5 10/18/2016   MCV 90 10/18/2016   PLT 376 10/18/2016   Recent Labs    10/18/16 0830  NA 143  K 4.1  CL 108*  CO2 22  GLUCOSE 86  BUN 14  CREATININE 0.60  CALCIUM 8.8    GFRNONAA 114  GFRAA 132  PROT 6.1  ALBUMIN 3.8  AST 14  ALT 24  ALKPHOS 40  BILITOT <0.2      ASSESSMENT & PLAN:  1. Ductal carcinoma in situ (DCIS) of left breast   2. Family history of breast cancer   3. Endometriosis   4. Encounter for surveillance of contraceptive pills    low grade with focal area of intermediate grade, DCIS,  close margin, < 56mm,   # Clinical trial COMET trial discussed with patient, she will be randomized to surgery arm including re-exision to achieve margin >10mm, RT followed by anti-hormone treatment, vs active surveillance and anti-hormone treatment.  Patient is in agreement with participating the trial. All questions answered to her satisfaction.   # Use of oral contraceptives for endometriosis. Will need discuss with Gyn to see if there is other options for treatment of endometriosis, including surgical option. Patient informs me that Dr.Byrnetts is planning to talk to GYN. Will check with D.Byrnett.  Hold tamoxifen until clarification of her contraceptive use.   # Family history of mother with breast cancer diagnosed at age of 103, with her personal history of DCIS at age of 42 yo. Refer to genetic testing. Discussed with patient,she is willing to.   All questions were answered. The patient knows to call the clinic with any problems questions or concerns.  Return of visit: follow up per her randomization. Clinical Trial RN Zigmund Daniel will coordinate.  Thank you for this kind referral and the opportunity to participate in the care of this patient. A copy of today's note is routed to referring provider    Earlie Server, MD, PhD Hematology Oncology [facility] Pager- [phone] 04/15/2017

## 2017-04-15 ENCOUNTER — Other Ambulatory Visit: Payer: Self-pay

## 2017-04-15 ENCOUNTER — Encounter: Payer: Self-pay | Admitting: Genetic Counselor

## 2017-04-15 ENCOUNTER — Inpatient Hospital Stay: Payer: BLUE CROSS/BLUE SHIELD | Attending: Oncology | Admitting: Oncology

## 2017-04-15 ENCOUNTER — Encounter: Payer: Self-pay | Admitting: Oncology

## 2017-04-15 ENCOUNTER — Inpatient Hospital Stay: Payer: BLUE CROSS/BLUE SHIELD

## 2017-04-15 ENCOUNTER — Telehealth: Payer: Self-pay | Admitting: Genetic Counselor

## 2017-04-15 ENCOUNTER — Encounter: Payer: Self-pay | Admitting: *Deleted

## 2017-04-15 VITALS — BP 153/98 | HR 70 | Temp 97.8°F | Resp 12 | Ht 63.5 in | Wt 239.4 lb

## 2017-04-15 DIAGNOSIS — D0512 Intraductal carcinoma in situ of left breast: Secondary | ICD-10-CM | POA: Insufficient documentation

## 2017-04-15 DIAGNOSIS — Z006 Encounter for examination for normal comparison and control in clinical research program: Secondary | ICD-10-CM | POA: Diagnosis not present

## 2017-04-15 DIAGNOSIS — N809 Endometriosis, unspecified: Secondary | ICD-10-CM | POA: Diagnosis not present

## 2017-04-15 DIAGNOSIS — Z3041 Encounter for surveillance of contraceptive pills: Secondary | ICD-10-CM

## 2017-04-15 DIAGNOSIS — Z793 Long term (current) use of hormonal contraceptives: Secondary | ICD-10-CM | POA: Diagnosis not present

## 2017-04-15 DIAGNOSIS — Z803 Family history of malignant neoplasm of breast: Secondary | ICD-10-CM | POA: Diagnosis not present

## 2017-04-15 LAB — CBC WITH DIFFERENTIAL/PLATELET
Basophils Absolute: 0.1 10*3/uL (ref 0–0.1)
Basophils Relative: 1 %
EOS ABS: 0.2 10*3/uL (ref 0–0.7)
EOS PCT: 2 %
HCT: 39.3 % (ref 35.0–47.0)
Hemoglobin: 13.2 g/dL (ref 12.0–16.0)
LYMPHS ABS: 3.5 10*3/uL (ref 1.0–3.6)
LYMPHS PCT: 43 %
MCH: 30 pg (ref 26.0–34.0)
MCHC: 33.7 g/dL (ref 32.0–36.0)
MCV: 89.1 fL (ref 80.0–100.0)
MONOS PCT: 5 %
Monocytes Absolute: 0.4 10*3/uL (ref 0.2–0.9)
Neutro Abs: 4 10*3/uL (ref 1.4–6.5)
Neutrophils Relative %: 49 %
PLATELETS: 393 10*3/uL (ref 150–440)
RBC: 4.41 MIL/uL (ref 3.80–5.20)
RDW: 13.3 % (ref 11.5–14.5)
WBC: 8.1 10*3/uL (ref 3.6–11.0)

## 2017-04-15 LAB — COMPREHENSIVE METABOLIC PANEL
ALBUMIN: 3.5 g/dL (ref 3.5–5.0)
ALT: 22 U/L (ref 14–54)
AST: 18 U/L (ref 15–41)
Alkaline Phosphatase: 41 U/L (ref 38–126)
Anion gap: 10 (ref 5–15)
BUN: 16 mg/dL (ref 6–20)
CHLORIDE: 105 mmol/L (ref 101–111)
CO2: 22 mmol/L (ref 22–32)
CREATININE: 0.58 mg/dL (ref 0.44–1.00)
Calcium: 9 mg/dL (ref 8.9–10.3)
GFR calc Af Amer: >60 mL/min (ref >60)
GFR calc non Af Amer: >60 mL/min (ref >60)
GLUCOSE: 97 mg/dL (ref 65–99)
POTASSIUM: 4 mmol/L (ref 3.5–5.1)
SODIUM: 137 mmol/L (ref 135–145)
Total Bilirubin: 0.6 mg/dL (ref 0.3–1.2)
Total Protein: 7 g/dL (ref 6.5–8.1)

## 2017-04-15 NOTE — Telephone Encounter (Signed)
Cancer Genetics            Telegenetics Initial Visit    Patient Name: Shannon Allen Patient DOB: September 06, 1975 Patient Age: 42 y.o. Phone Call Date: 04/15/2017  Referring Provider: Earlie Server, MD  Reason for Visit: Evaluate for hereditary susceptibility to cancer    Assessment and Plan:  . Shannon Allen's history is not highly suggestive of a hereditary predisposition to cancer but given her age at diagnosis in combination with breast cancer in her mother, a genetics evaluation is indicated.   . Testing is recommended to determine whether she has a pathogenic mutation that will impact her screening and risk-reduction for cancer. A negative result will be reassuring.  . Shannon Allen wished to pursue genetic testing and will schedule a lab visit for a blood sample. Analysis will include the 23 genes on [facility]'s Breast/GYN panel (ATM, BARD1, BRCA1, BRCA2, BRIP1, CDH1, CHEK2, DICER1, EPCAM, MLH1,  MSH2, MSH6, NBN, NF1, PALB2, PMS2, PTEN, RAD50, RAD51C, RAD51D,SMARCA4, STK11, and TP53).  Marland Kitchen Results should be available in approximately 2-3 weeks, at which point we will contact her and address implications for her as well as address genetic testing for at-risk family members, if needed.     Dr. Grayland Ormond was available for questions concerning this case. Total time spent by counseling by phone was approximately 25 minutes.   _____________________________________________________________________   History of Present Illness: Shannon Allen, a 42 y.o. female, was referred for genetic counseling to discuss the possibility of a hereditary predisposition to cancer and discuss whether genetic testing is warranted. This was a telegenetics visit via phone.  Shannon Allen was diagnosed with breast cancer (DCIS) at the age of 42. The tumor was ER/PR positive. She is currently weighing her options for treatment.   Past Medical History:  Diagnosis Date  . ADHD (attention deficit  hyperactivity disorder)   . 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 Breast cancer ([place]) 03/2017  . Depression   . Endometriosis determined by laparoscopy 2005  . GERD (gastroesophageal reflux disease)   . Headache   . History of kidney stones     Past Surgical History:  Procedure Laterality Date  . BREAST BIOPSY Left 03/09/2017   left breast stereo path ATYPICAL INTRADUCTAL PROLIFERATION   . BREAST BIOPSY Left 04/01/2017   Procedure: BREAST BIOPSY WITH NEEDLE LOCALIZATION;  Surgeon: [name], MD;  Location: [facility] ORS;  Service: General;  Laterality: Left;  . BREAST EXCISIONAL BIOPSY Left 04/01/2017   lumpectomy done by Dr. Bary Castilla  . laproscopic surgery  2005  . TONSILLECTOMY      Family History: Significant diagnoses include the following:  Family History  Problem Relation Age of Onset  . Hyperlipidemia Mother   . Breast cancer Mother 71       currently 17  . Kidney disease Brother   . Hypertension Brother   . Hyperlipidemia Brother   . Alzheimer's disease Maternal Grandmother   . Heart disease Maternal Grandfather   . Lung cancer Paternal Grandfather        smoker  . Skin cancer Paternal Grandfather 55  . Skin cancer Maternal Aunt   . Skin cancer Paternal Aunt 69  . Cancer Other        3 brothers of maternal grandfather with lung, throat and salivary gland cancers; all were smokers.  . Colon cancer Neg Hx     Additionally, Shannon Allen has  no children. She had a brother who died at 56 due to HIV. Her mother (noted above) has one sister (age 57s). Her father (age 64) has a brother and sister and neither of them have children.  Ms. Lebon ancestry is Caucasian - NOS There is no known Jewish ancestry and no consanguinity.  Discussion: We reviewed the characteristics, features and inheritance patterns of hereditary cancer syndromes. We discussed her risk of harboring a mutation in the context of her personal and family history. We discussed the process of genetic  testing, insurance coverage and implications of results: positive, negative and variant of unknown significance (VUS).   Ms. Eriksson questions were answered to her satisfaction today and she is welcome to call with any additional questions or concerns. Thank you for the referral and allowing [place] to share in the care of your patient.    Steele Berg, MS, [place] Certified Genetic Counselor phone: [phone]

## 2017-04-15 NOTE — Telephone Encounter (Signed)
Dr. Tasia Catchings is referring Shannon Allen for genetic counseling due to a personal and family history of cancer. I left her a message to call and schedule this telegenetics visit to be done by phone at her convenience.   Steele Berg, [place], [place] Genetic Counselor Phone: [phone]

## 2017-04-15 NOTE — Progress Notes (Signed)
Patient here initial visit. No complaints today.

## 2017-04-15 NOTE — Progress Notes (Signed)
  Oncology Nurse Navigator Documentation  Navigator Location: [facility] (04/15/17 1100) Referral date to RadOnc/MedOnc: 04/15/17 (04/15/17 1100) )Navigator Encounter Type: Initial MedOnc (04/15/17 1100)                         Barriers/Navigation Needs: Education (04/15/17 1100) Education: Newly Diagnosed Cancer Education (04/15/17 1100) Interventions: Education (04/15/17 1100)     Education Method: Verbal;Written (04/15/17 1100)                Time Spent with Patient: 15 (04/15/17 1100)   Met patient today during her initial medical oncology consult.  Gave patient breast cancer educational literature, "My Breast Cancer Treatment Handbook" by Josephine Igo, RN.  Shannon Jolly, RN present to discuss the COMET trial.  Patient to call if she has any questions or needs.

## 2017-04-18 ENCOUNTER — Telehealth: Payer: Self-pay | Admitting: *Deleted

## 2017-04-18 NOTE — Telephone Encounter (Signed)
T/C made to patient to discuss the recommendation that she stop taking birth control pills and begin Tamoxifen. Dr. Kenton Kingfisher has weighed in and states there is no other medication she can take for her endometriosis. Patient states she has an appointment on 05/13/17 with Dr. Marcelline Mates to discuss possibility of having a hysterectomy. Ms. [place] states she does not want to stop taking the [facility] because she does not want to experience the pain related to endometriosis and fears it would flare back up. States she will wait until after she consults with Dr. Marcelline Mates before coming off the BCPs. She also questions when she will have an appointment for genetic testing. States the genetic counselor was suppose to send over an order for this and she is waiting to be scheduled for this as well. Will check with Dr. Tasia Catchings. Yolande Jolly, BSN, MHA, OCN 04/18/2017 4:22 PM

## 2017-04-19 ENCOUNTER — Ambulatory Visit: Payer: Self-pay

## 2017-04-21 ENCOUNTER — Inpatient Hospital Stay: Payer: BLUE CROSS/BLUE SHIELD

## 2017-05-05 ENCOUNTER — Ambulatory Visit: Payer: Self-pay | Admitting: Family Medicine

## 2017-05-10 ENCOUNTER — Telehealth: Payer: Self-pay | Admitting: *Deleted

## 2017-05-10 NOTE — Telephone Encounter (Signed)
T/C made to patient to verify that she is moving her care, including participation in the COMET trial, to [place]. Patient states she went to [facility] for a second opinion because her mother had been treated for breast cancer there. She was seen by Dr. Lawanna Kobus, who is the Principle Investigator for the COMET trial, and the patient has decided to continue her care for her current DCIS at [facility]. Will make appropriate arrangements to have patient re designated for the study as being enrolled at [facility] and will notify Dr. Bary Castilla and Dr. Tasia Catchings of her transfer. Yolande Jolly, BSN, MHA, OCN 05/10/2017 2:07 PM

## 2017-05-12 ENCOUNTER — Encounter: Payer: Self-pay | Admitting: Genetic Counselor

## 2017-05-12 ENCOUNTER — Telehealth: Payer: Self-pay | Admitting: Genetic Counselor

## 2017-05-12 DIAGNOSIS — Z1379 Encounter for other screening for genetic and chromosomal anomalies: Secondary | ICD-10-CM

## 2017-05-12 HISTORY — DX: Encounter for other screening for genetic and chromosomal anomalies: Z13.79

## 2017-05-12 NOTE — Telephone Encounter (Signed)
Cancer Genetics             Telegenetics Results Disclosure   Patient Name: Shannon Allen Patient DOB: [birth year] Patient Age: 42 y.o. Phone Call Date: 05/12/2017  Referring Provider: Earlie Server, MD    Shannon Allen was called today to discuss genetic test results. Please see the Genetics telephone note from 04/15/2017 for a detailed discussion of her personal and family histories and the recommendations provided.  Genetic Testing: At the time of Shannon Allen's telegenetics visit, she decided to pursue genetic testing of multiple genes associated with hereditary susceptibility to cancer. Testing included sequencing and deletion/duplication analysis. Testing did not reveal a pathogenic mutation in any of the genes analyzed.  A copy of the genetic test report will be scanned into Epic under the Media tab.  The genes analyzed were the 23 genes on [facility]'s Breast/GYN panel (ATM, BARD1, BRCA1, BRCA2, BRIP1, CDH1, CHEK2, DICER1, EPCAM, MLH1,  MSH2, MSH6, NBN, NF1, PALB2, PMS2, PTEN, RAD50, RAD51C, RAD51D,SMARCA4, STK11, and TP53).  Since the current test is not perfect, it is possible that there may be a gene mutation that current testing cannot detect, but that chance is small. It is possible that a different genetic factor, which has not yet been discovered or is not on this panel, is responsible for the cancer diagnoses in the family. Again, the likelihood of this is low. No additional testing is recommended at this time for Shannon Allen.  Cancer Screening: These results suggest that Shannon Allen's cancer was most likely not due to an inherited predisposition. Most cancers happen by chance and this test, along with details of her family history, suggests that her cancer falls into this category. She is recommended to follow the cancer screening guidelines provided by her physician.   Family Members: Given the young age of breast cancer in Shannon Allen (age 57), women are recommended  to speak with their own providers about having a yearly mammogram beginning at age 73, which is 29 years earlier than the youngest breast cancer in the family and discuss with their own provider whether there is a benefit to adding tomosynthesis to the mammogram. Women are recommended to also have a yearly clinical breast exam, a yearly gynecologic exam and perform monthly breast self-exams. Colon cancer screening is recommended to begin by age 85 in both men and women, unless there is a family history of colon cancer or colon polyps or an individual has a personal history to warrant initiating screening at a younger age.  Any relative who had cancer at a young age or had a particularly rare cancer may also wish to pursue genetic testing. Genetic counselors can be located in other cities, by visiting the website of the [employer] (Artist[employer].se) and Field seismologist for a Dietitian by zip code.    Lastly, cancer genetics is a rapidly advancing field and it is possible that new genetic tests will be appropriate for Shannon Allen in the future. We encourage Shannon Allen to remain in contact with Genetics on an annual basis so her personal and family histories can be updated.    Steele Berg, MS, [place] Certified Genetic Counselor phone: [phone]

## 2017-05-13 ENCOUNTER — Encounter: Payer: BLUE CROSS/BLUE SHIELD | Admitting: Obstetrics and Gynecology

## 2017-06-24 ENCOUNTER — Encounter: Payer: Self-pay | Admitting: Oncology

## 2017-07-13 ENCOUNTER — Telehealth: Payer: Self-pay

## 2017-07-13 NOTE — Telephone Encounter (Signed)
Yes she will need an appointment with 1 of the providers.

## 2017-07-13 NOTE — Telephone Encounter (Signed)
Called patient to schedule appointment. Left message on machine for pt to return call to the office.

## 2017-07-13 NOTE — Telephone Encounter (Signed)
Patient has not been seen since 2018. Routing to directed provider based of last name for Rachel's patient's.  Dr. Jeananne Rama, is something we do for patients?

## 2017-07-13 NOTE — Telephone Encounter (Signed)
Can someone schedule this appointment?

## 2017-07-13 NOTE — Telephone Encounter (Signed)
Copied from [place] [phone]. Topic: Appointment Scheduling - Scheduling Inquiry for Clinic >> Jul 13, 2017  8:35 AM Ether Griffins B wrote: Reason for CRM: pt would like an appt to fill out foster care paper work. She needs either first thing in the morning or late afternoon next week.

## 2017-07-14 NOTE — Telephone Encounter (Signed)
Scheduled

## 2017-07-18 ENCOUNTER — Ambulatory Visit (INDEPENDENT_AMBULATORY_CARE_PROVIDER_SITE_OTHER): Payer: BLUE CROSS/BLUE SHIELD | Admitting: Family Medicine

## 2017-07-18 ENCOUNTER — Encounter: Payer: Self-pay | Admitting: Family Medicine

## 2017-07-18 VITALS — BP 120/79 | HR 73 | Ht 66.0 in | Wt 242.0 lb

## 2017-07-18 DIAGNOSIS — F909 Attention-deficit hyperactivity disorder, unspecified type: Secondary | ICD-10-CM | POA: Diagnosis not present

## 2017-07-18 DIAGNOSIS — Z021 Encounter for pre-employment examination: Secondary | ICD-10-CM | POA: Diagnosis not present

## 2017-07-18 DIAGNOSIS — D0512 Intraductal carcinoma in situ of left breast: Secondary | ICD-10-CM | POA: Diagnosis not present

## 2017-07-18 DIAGNOSIS — Z6841 Body Mass Index (BMI) 40.0 and over, adult: Secondary | ICD-10-CM

## 2017-07-18 NOTE — Progress Notes (Signed)
BP 120/79   Pulse 73   Ht 5\' 6"  (1.676 m)   Wt 242 lb (109.8 kg)   SpO2 97%   BMI 39.06 kg/m    Subjective:    Patient ID: Shannon Allen, female    DOB: [birth year], 42 y.o.   MRN: [id number]  HPI: Shannon Allen is a 42 y.o. female  Chief Complaint  Patient presents with  . Form Completion    For foster parent.   Shannon Allen is a 10yo woman who presents today for a form filled out for her to be a foster parent. She is feeling well. No limitations. She is noting that her R hip is acting up a little bit since she had a massage on Thursday. She notes that her ADD is under good control, self-managed. 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 No other concerns or complaints at this time.   Relevant past medical, surgical, family and social history reviewed and updated as indicated. Interim medical history since our last visit reviewed. Allergies and medications reviewed and updated.  Review of Systems  Constitutional: Negative.   Respiratory: Negative.   Cardiovascular: Negative.   Musculoskeletal: Positive for arthralgias and myalgias. Negative for back pain, gait problem, joint swelling, neck pain and neck stiffness.  Skin: Negative.   Psychiatric/Behavioral: Negative.     Per HPI unless specifically indicated above     Objective:    BP 120/79   Pulse 73   Ht 5\' 6"  (1.676 m)   Wt 242 lb (109.8 kg)   SpO2 97%   BMI 39.06 kg/m   Wt Readings from Last 3 Encounters:  07/18/17 242 lb (109.8 kg)  04/15/17 239 lb 6.4 oz (108.6 kg)  04/12/17 239 lb (108.4 kg)    Physical Exam  Constitutional: She is oriented to person, place, and time. She appears well-developed and well-nourished. No distress.  HENT:  Head: Normocephalic and atraumatic.  Right Ear: Hearing and external ear normal.  Left Ear: Hearing and external ear normal.  Nose: Nose normal.  Mouth/Throat: Oropharynx is clear and moist. No oropharyngeal exudate.  Eyes:  Pupils are equal, round, and reactive to light. Conjunctivae, EOM and lids are normal. Right eye exhibits no discharge. Left eye exhibits no discharge. No scleral icterus.  Neck: Normal range of motion. No JVD present. No tracheal deviation present. No thyromegaly present.  Cardiovascular: Normal rate, regular rhythm, normal heart sounds and intact distal pulses. Exam reveals no gallop and no friction rub.  No murmur heard. Pulmonary/Chest: Effort normal and breath sounds normal. No stridor. No respiratory distress. She has no wheezes. She has no rales. She exhibits no tenderness.  Abdominal: Soft. Bowel sounds are normal. She exhibits no distension and no mass. There is no tenderness. There is no rebound and no guarding. No hernia.  Musculoskeletal: Normal range of motion.  Lymphadenopathy:    She has no cervical adenopathy.  Neurological: She is alert and oriented to person, place, and time.  Skin: Skin is warm, dry and intact. Capillary refill takes less than 2 seconds. No rash noted. She is not diaphoretic. No erythema. No pallor.  Psychiatric: She has a normal mood and affect. Her speech is normal and behavior is normal. Judgment and thought content normal. Cognition and memory are normal.  Nursing note and vitals reviewed.   Results for orders placed or performed in visit on 04/15/17  Comprehensive metabolic panel  Result Value Ref Range  Sodium 137 135 - 145 mmol/L   Potassium 4.0 3.5 - 5.1 mmol/L   Chloride 105 101 - 111 mmol/L   CO2 22 22 - 32 mmol/L   Glucose, Bld 97 65 - 99 mg/dL   BUN 16 6 - 20 mg/dL   Creatinine, Ser 0.58 0.44 - 1.00 mg/dL   Calcium 9.0 8.9 - 10.3 mg/dL   Total Protein 7.0 6.5 - 8.1 g/dL   Albumin 3.5 3.5 - 5.0 g/dL   AST 18 15 - 41 U/L   ALT 22 14 - 54 U/L   Alkaline Phosphatase 41 38 - 126 U/L   Total Bilirubin 0.6 0.3 - 1.2 mg/dL   GFR calc non Af Amer >60 >60 mL/min   GFR calc Af Amer >60 >60 mL/min   Anion gap 10 5 - 15  CBC with  Differential/Platelet  Result Value Ref Range   WBC 8.1 3.6 - 11.0 K/uL   RBC 4.41 3.80 - 5.20 MIL/uL   Hemoglobin 13.2 12.0 - 16.0 g/dL   HCT 39.3 35.0 - 47.0 %   MCV 89.1 80.0 - 100.0 fL   MCH 30.0 26.0 - 34.0 pg   MCHC 33.7 32.0 - 36.0 g/dL   RDW 13.3 11.5 - 14.5 %   Platelets 393 150 - 440 K/uL   Neutrophils Relative % 49 %   Neutro Abs 4.0 1.4 - 6.5 K/uL   Lymphocytes Relative 43 %   Lymphs Abs 3.5 1.0 - 3.6 K/uL   Monocytes Relative 5 %   Monocytes Absolute 0.4 0.2 - 0.9 K/uL   Eosinophils Relative 2 %   Eosinophils Absolute 0.2 0 - 0.7 K/uL   Basophils Relative 1 %   Basophils Absolute 0.1 0 - 0.1 K/uL      Assessment & Plan:   Problem List Items Addressed This Visit      Other   ADHD (attention deficit hyperactivity disorder)    Not on any medication. Doing well. No concerns or complaints at this time.       Class 3 severe obesity due to excess calories without serious comorbidity with body mass index (BMI) of 40.0 to 44.9 in adult [facility])    Continue to work on slow weight loss of 1-2lbs a week. Call with any concerns.       DCIS (ductal carcinoma in situ)    Being managed by [facility]. Doing well. Call with any concerns.       Relevant Medications   tamoxifen (NOLVADEX) 20 MG tablet    Other Visit Diagnoses    Pre-employment examination    -  Primary   Cleared. Form filled out. See scanned document.        Follow up plan: Return August- physical.

## 2017-07-18 NOTE — Assessment & Plan Note (Signed)
Being managed by [facility]. Doing well. Call with any concerns.

## 2017-07-18 NOTE — Assessment & Plan Note (Signed)
Not on any medication. Doing well. No concerns or complaints at this time.

## 2017-07-18 NOTE — Assessment & Plan Note (Signed)
Continue to work on slow weight loss of 1-2lbs a week. Call with any concerns.

## 2017-07-18 NOTE — Patient Instructions (Signed)

## 2017-10-11 DIAGNOSIS — Z6841 Body Mass Index (BMI) 40.0 and over, adult: Secondary | ICD-10-CM | POA: Insufficient documentation

## 2017-10-11 DIAGNOSIS — K644 Residual hemorrhoidal skin tags: Secondary | ICD-10-CM | POA: Insufficient documentation

## 2017-10-11 DIAGNOSIS — F40243 Fear of flying: Secondary | ICD-10-CM | POA: Insufficient documentation

## 2017-10-11 DIAGNOSIS — N809 Endometriosis, unspecified: Secondary | ICD-10-CM | POA: Insufficient documentation

## 2017-11-02 ENCOUNTER — Encounter: Payer: BLUE CROSS/BLUE SHIELD | Admitting: Family Medicine

## 2018-01-02 ENCOUNTER — Encounter: Payer: BLUE CROSS/BLUE SHIELD | Attending: Student | Admitting: Dietician

## 2018-01-02 ENCOUNTER — Encounter: Payer: Self-pay | Admitting: Dietician

## 2018-01-02 VITALS — Ht 62.0 in | Wt 256.0 lb

## 2018-01-02 DIAGNOSIS — Z6841 Body Mass Index (BMI) 40.0 and over, adult: Secondary | ICD-10-CM | POA: Diagnosis not present

## 2018-01-02 NOTE — Patient Instructions (Addendum)
Try to eat 3 meals /day spaced 4-5 hours apart. If more than 6 hours, include a small snack consisting of 1 serving of carbohydrate and 1 oz of protein. Refer to list. Balance meals with lean protein, 2-3 starch servings, 1 serving of fruit and at least 1 cup of non-starchy vegetable. If can't eat all of your lunch meal at one time, split into two snacks. Consider eating a small serving of fruit before leaving work in order to not be as hungry after your commute home. Use myfitnesspal to record at least 1 week of food/beverage intake. Calories for weight loss- 1800

## 2018-01-02 NOTE — Progress Notes (Signed)
Medical Nutrition Therapy: Visit start time:1600  end time: 1700  Assessment:  Diagnosis: obesity BMI 46.8 lbs Past medical history:breast  cancer Psychosocial issues/ stress concerns:  Rates stress as high; had partial mastectomy 10/22. States she is dealing "very well" with her stress.  Preferred learning method:  . Auditory . Hands-on  Current weight: 256 lbs  Height: 62 in Medications, supplements:see list  Progress and evaluation:  Patient in for initial medical nutrition therapy appointment.Reports a highest adult weight of 260 lbs. She used Herbalife shakes/products to help with weight loss 2 years ago and lost down to 198 lbs. Reports she was diagnosed with breast cancer 1/'19 and has gained approximately 20+ lbs since then. She states that if she has to have a complete mastectomy and reconstruction surgery, her BMI has to be less than 35.  She continues to drink an Herbalife shake for breakfast and states she has never been a "breakfast person". She states she is trying to eat healthier and reports a history of frequent intake of high fat fast foods. She states she does not have a consistent meal pattern. She may eat lunch between 11:00am-2:00pm or may skip and is extremely hungry once she arrives home after work. She states she does some meal prep on the weekends for dinner meals during the week.  She gave meal examples of pork tenderloin, roasted potatoes, broccoli or a grilled chicken salad. Her main beverage is water; sometimes flavors with Crystal lite. She sometimes (2x/week) drinks another Herbalife shake as a substitute for dinner or for an evening snack if dinner has been earlier than usual. She states that she loves breads, pasta and other carbohydrates and often eats large portions.  Patient's states that her most recent HgA1c was 6.1 and this was 1st time tested.  Physical activity: none   Nutrition Care Education:   Weight control/ elevated HgA1c:  Instructed on a  meal plan based on 1800 calories including carbohydrate counting and how to better balance carbohydrate, protein, fat and non-starchy vegetables. Encouraged to use as a guide to be more mindful of food choices and portions allowing for flexibility rather than an overly restrictive meal plan. Gave and reviewed menu examples. Stressed importance of spacing meals 4-5 hours apart and using a small snack to fill in gap if meals are greater than 6 hours apart.  Nutritional Diagnosis:  [place]-3.3 Overweight/obesity As related to history of eating frequent high fat fast foods and inconsistent meal pattern .  As evidenced by diet history..  Intervention: Try to eat 3 meals /day spaced 4-5 hours apart. If more than 6 hours, include a small snack consisting of 1 serving of carbohydrate and 1 oz of protein. Refer to list. Balance meals with lean protein, 2-3 starch servings, 1 serving of fruit and at least 1 cup of non-starchy vegetable. If can't eat all of your lunch meal at one time, split into two snacks. Consider eating a small serving of fruit before leaving work in order to not be as hungry after your commute home. Use myfitnesspal to record at least 1 week of food/beverage intake. Calories for weight loss- 1800   Education Materials given:  . Plate Planner . Food lists/ Planning A Balanced Meal . Quick and Healthy Meals . Sample meal pattern/ menus . Snacking handout . Goals/ instructions Learner/ who was taught:  . Patient   Level of understanding: . Partial understanding; needs review/ practice  Demonstrated degree of understanding via:   Teach back Learning barriers: .  None Willingness to learn/ readiness for change: . Acceptance, ready for change  Monitoring and Evaluation:  Dietary intake, exercise,  and body weight      follow up: 01/31/18 at 10:30am

## 2018-01-11 DIAGNOSIS — C50912 Malignant neoplasm of unspecified site of left female breast: Secondary | ICD-10-CM | POA: Insufficient documentation

## 2018-01-11 DIAGNOSIS — Z170 Estrogen receptor positive status [ER+]: Secondary | ICD-10-CM | POA: Insufficient documentation

## 2018-01-18 ENCOUNTER — Encounter: Payer: Self-pay | Admitting: Radiation Oncology

## 2018-01-18 ENCOUNTER — Other Ambulatory Visit: Payer: Self-pay

## 2018-01-18 ENCOUNTER — Ambulatory Visit
Admission: RE | Admit: 2018-01-18 | Discharge: 2018-01-18 | Disposition: A | Discharge status: Home / Self Care | Payer: BLUE CROSS/BLUE SHIELD | Source: Ambulatory Visit | Attending: Radiation Oncology | Admitting: Radiation Oncology

## 2018-01-18 DIAGNOSIS — K219 Gastro-esophageal reflux disease without esophagitis: Secondary | ICD-10-CM | POA: Diagnosis not present

## 2018-01-18 DIAGNOSIS — Z801 Family history of malignant neoplasm of trachea, bronchus and lung: Secondary | ICD-10-CM | POA: Diagnosis not present

## 2018-01-18 DIAGNOSIS — F909 Attention-deficit hyperactivity disorder, unspecified type: Secondary | ICD-10-CM | POA: Diagnosis not present

## 2018-01-18 DIAGNOSIS — Z809 Family history of malignant neoplasm, unspecified: Secondary | ICD-10-CM | POA: Insufficient documentation

## 2018-01-18 DIAGNOSIS — F418 Other specified anxiety disorders: Secondary | ICD-10-CM | POA: Insufficient documentation

## 2018-01-18 DIAGNOSIS — Z170 Estrogen receptor positive status [ER+]: Secondary | ICD-10-CM | POA: Insufficient documentation

## 2018-01-18 DIAGNOSIS — Z79899 Other long term (current) drug therapy: Secondary | ICD-10-CM | POA: Insufficient documentation

## 2018-01-18 DIAGNOSIS — Z803 Family history of malignant neoplasm of breast: Secondary | ICD-10-CM | POA: Diagnosis not present

## 2018-01-18 DIAGNOSIS — Z79810 Long term (current) use of selective estrogen receptor modulators (SERMs): Secondary | ICD-10-CM | POA: Insufficient documentation

## 2018-01-18 DIAGNOSIS — D0512 Intraductal carcinoma in situ of left breast: Secondary | ICD-10-CM | POA: Diagnosis not present

## 2018-01-18 DIAGNOSIS — Z87442 Personal history of urinary calculi: Secondary | ICD-10-CM | POA: Diagnosis not present

## 2018-01-18 NOTE — Consult Note (Signed)
NEW PATIENT EVALUATION  Name: Shannon Allen  MRN: [id number]  Date:   01/18/2018     DOB: March 03, 1976   This 42 y.o. 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  REFERRING PHYSICIAN: Wayland Denis, PA-C  CHIEF COMPLAINT:  Chief Complaint  Patient presents with  . Breast Cancer    Initial Eval    DIAGNOSIS: The encounter diagnosis was Ductal carcinoma in situ (DCIS) of left breast.   PREVIOUS INVESTIGATIONS:  Mammogram and ultrasound reports reviewed Pathology reports reviewed Clinical notes reviewed  HPI: patient is a 42 year old female who originally presented back in January 2019 with an abnormal mammogram of the left breast showing indeterminate calcifications in the lower slightly outer quadrant. Stereotactic core biopsy was positive for atypical ductal proliferation. She went on to have a wide local excision by Dr. Tollie Pizza showing low-grade DCIS with margins clear at 2 mm. sHE WENT ON THECOMET study for tamoxifen with no adjuvant treatment.follow-up mammograms showed a new cluster of my speckled calcifications in this region she underwent 11/29/2017 wide local excision with pathology consistent with invasive carcinoma a T1 be lesion. With a positive medial margin for DCIS and she underwent reexcision with no residual disease noted.She was seen at [facility] for radiation oncology and medical oncology opinion. 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 No lymphovascular invasion. 2 sentinel lymph nodes were negative for metastatic disease. Tumor was ER/PR positive HER-2/neu not overexpressed. Recommendation was made for continuation on  tamoxifen. She isnow seen by radiation oncology closer to home for opinion she is doing well. She specifically denies breast tenderness cough or bone pain.  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    PAST SURGICAL HISTORY:  Past Surgical History:  Procedure Laterality Date  . BREAST BIOPSY Left 03/09/2017   left breast stereo path ATYPICAL INTRADUCTAL PROLIFERATION   . BREAST BIOPSY Left 04/01/2017   Procedure: BREAST BIOPSY WITH NEEDLE LOCALIZATION;  Surgeon: [name], MD;  Location: [facility] ORS;  Service: General;  Laterality: Left;  . BREAST EXCISIONAL BIOPSY Left 04/01/2017   lumpectomy done by Dr. Bary Castilla  . laproscopic surgery  2005  . TONSILLECTOMY      FAMILY HISTORY: family history includes Alzheimer's disease in her maternal grandmother; Breast cancer (age of onset: 44) in her mother; Cancer in her other; Heart disease in her maternal grandfather; Hyperlipidemia in her brother and mother; Hypertension in her brother; Kidney disease in her brother; Lung cancer in her paternal grandfather; Skin cancer in her maternal aunt; Skin cancer (age of onset: 53) in her paternal aunt; Skin cancer (age of onset: 38) in her paternal grandfather.  SOCIAL HISTORY:  reports that she has never smoked. She has never used smokeless tobacco. She reports that she drinks alcohol. She reports that she does not use drugs.  ALLERGIES: Erythromycin and Adhesive [tape]  MEDICATIONS:  Current Outpatient Medications  Medication Sig Dispense Refill  .  ALPRAZolam (XANAX) 0.5 MG tablet Take 1 tablet (0.5 mg  total) by mouth 2 (two) times daily as needed for anxiety. (Patient taking differently: Take 0.5 mg by mouth 2 (two) times daily as needed for anxiety (For surgery). ) 5 tablet 0  . BIOTIN PO Take by mouth.    . cetirizine (ZYRTEC) 10 MG tablet Take 10 mg by mouth at bedtime.     . diclofenac (CATAFLAM) 50 MG tablet Take 1 tablet (50 mg total) by mouth 2 (two) times daily. (Patient taking differently: Take 50 mg by mouth 2 (two) times daily as needed (for pain/cramps). ) 60 tablet 6  . fluticasone (FLONASE) 50 MCG/ACT nasal spray Place 2 sprays into both nostrils daily as needed (for allergies.).     Marland Kitchen HYDROcodone-acetaminophen (NORCO/VICODIN) 5-325 MG tablet Take 1 tablet by mouth every 4 (four) hours as needed for moderate pain. (Patient not taking: Reported on 07/18/2017) 15 tablet 0  . ibuprofen (ADVIL,MOTRIN) 200 MG tablet Take 400-600 mg by mouth every 8 (eight) hours as needed (for pain.).    Marland Kitchen levonorgestrel (MIRENA) 20 MCG/24HR IUD 1 each by Intrauterine route once.    . Levonorgestrel-Ethinyl Estradiol (AMETHIA,CAMRESE) 0.15-0.03 &0.01 MG tablet Take 1 tablet by mouth daily. (Patient taking differently: Take 1 tablet by mouth at bedtime. ) 91 tablet 3  . oxycodone (OXY-IR) 5 MG capsule Take 5 mg by mouth every 4 (four) hours as needed.    . phentermine (ADIPEX-P) 37.5 MG tablet Take 1 tablet (37.5 mg total) by mouth daily before breakfast. (Patient not taking: Reported on 01/02/2018) 30 tablet 1  . tamoxifen (NOLVADEX) 20 MG tablet Take by mouth.    . vitamin B-12 (CYANOCOBALAMIN) 500 MCG tablet Take 500 mcg by mouth daily.     No current facility-administered medications for this encounter.     ECOG PERFORMANCE STATUS:  0 - Asymptomatic  REVIEW OF SYSTEMS:  Patient denies any weight loss, fatigue, weakness, fever, chills or night sweats. Patient denies any loss of vision, blurred vision. Patient denies any ringing  of the ears or hearing loss. No irregular heartbeat. Patient denies  heart murmur or history of fainting. Patient denies any chest pain or pain radiating to her upper extremities. Patient denies any shortness of breath, difficulty breathing at night, cough or hemoptysis. Patient denies any swelling in the lower legs. Patient denies any nausea vomiting, vomiting of blood, or coffee ground material in the vomitus. Patient denies any stomach pain. Patient states has had normal bowel movements no significant constipation or diarrhea. Patient denies any dysuria, hematuria or significant nocturia. Patient denies any problems walking, swelling in the joints or loss of balance. Patient denies any skin changes, loss of hair or loss of weight. Patient denies any excessive worrying or anxiety or significant depression. Patient denies any problems with insomnia. Patient denies excessive thirst, polyuria, polydipsia. Patient denies any swollen glands, patient denies easy bruising or easy bleeding. Patient denies any recent infections, allergies or URI. Patient "s visual fields have not changed significantly in recent time.    PHYSICAL EXAM: BP (P) 122/78 (BP Location: Right Arm, Patient Position: Sitting)   Pulse (P) 88   Temp (!) (P) 97.5 F (36.4 C) (Tympanic)   Wt (P) 256 lb 15.1 oz (116.6 kg)   BMI (P) 47.00 kg/m  Left breast is wide local excision scar which is healed well. No dominant mass or nodularity is noted in either breast in 2 positions examined. No axillary or supraclavicular adenopathy is noted.Well-developed  well-nourished patient in NAD. HEENT reveals PERLA, EOMI, discs not visualized.  Oral cavity is clear. No oral mucosal lesions are identified. Neck is clear without evidence of cervical or supraclavicular adenopathy. Lungs are clear to A&P. Cardiac examination is essentially unremarkable with regular rate and rhythm without murmur rub or thrill. Abdomen is benign with no organomegaly or masses noted. Motor sensory and DTR levels are equal and symmetric in the upper  and lower extremities. Cranial nerves II through XII are grossly intact. Proprioception is intact. No peripheral adenopathy or edema is identified. No motor or sensory levels are noted. Crude visual fields are within normal range.  LABORATORY DATA: pathology reports reviewed    RADIOLOGY RESULTS:mammogram and ultrasound reports reviewed   IMPRESSION: for stage I (T1 BN 0 M0) ER/PR positive HER-2/neu negative invasive mammary carcinoma in the left breast status post wide local excision and sentinel node biopsy in 42 year old female.  PLAN: this time I have recommended hypofractionated course of radiation therapy over 4 weeks. I'm also concerned about the reexcision with no residual tumor found and I would boost her scar another 1400 cGy using electron beam. Risks and benefits of treatment including skin reaction fatigue alteration of blood counts possible inclusion of superficial lung all were described in detail. Patient comprehends our treatment plan well. I've asked her to stop taking her tamoxifen during treatmentand will resume shortly thereafter. I personally set up and ordered CT simulation in about 2 weeks time since she is going on a cruise next week.  I would like to take this opportunity to thank you for allowing me to participate in the care of your patient.Noreene Filbert, MD

## 2018-01-23 ENCOUNTER — Ambulatory Visit: Payer: BLUE CROSS/BLUE SHIELD

## 2018-01-27 ENCOUNTER — Ambulatory Visit
Admission: RE | Admit: 2018-01-27 | Discharge: 2018-01-27 | Disposition: A | Discharge status: Home / Self Care | Payer: BLUE CROSS/BLUE SHIELD | Source: Ambulatory Visit | Attending: Radiation Oncology | Admitting: Radiation Oncology

## 2018-01-27 DIAGNOSIS — D0512 Intraductal carcinoma in situ of left breast: Secondary | ICD-10-CM | POA: Diagnosis present

## 2018-01-30 ENCOUNTER — Other Ambulatory Visit: Payer: Self-pay | Admitting: *Deleted

## 2018-01-30 DIAGNOSIS — D0512 Intraductal carcinoma in situ of left breast: Secondary | ICD-10-CM

## 2018-01-31 ENCOUNTER — Ambulatory Visit: Payer: Self-pay | Admitting: Dietician

## 2018-02-07 ENCOUNTER — Ambulatory Visit
Admission: RE | Admit: 2018-02-07 | Discharge: 2018-02-07 | Disposition: A | Discharge status: Home / Self Care | Payer: BLUE CROSS/BLUE SHIELD | Source: Ambulatory Visit | Attending: Radiation Oncology | Admitting: Radiation Oncology

## 2018-02-07 DIAGNOSIS — D0512 Intraductal carcinoma in situ of left breast: Secondary | ICD-10-CM

## 2018-02-08 ENCOUNTER — Ambulatory Visit
Admission: RE | Admit: 2018-02-08 | Discharge: 2018-02-08 | Disposition: A | Discharge status: Home / Self Care | Payer: BLUE CROSS/BLUE SHIELD | Source: Ambulatory Visit | Attending: Radiation Oncology | Admitting: Radiation Oncology

## 2018-02-08 DIAGNOSIS — D0512 Intraductal carcinoma in situ of left breast: Secondary | ICD-10-CM | POA: Diagnosis not present

## 2018-02-09 ENCOUNTER — Ambulatory Visit
Admission: RE | Admit: 2018-02-09 | Discharge: 2018-02-09 | Disposition: A | Discharge status: Home / Self Care | Payer: BLUE CROSS/BLUE SHIELD | Source: Ambulatory Visit | Attending: Radiation Oncology | Admitting: Radiation Oncology

## 2018-02-09 DIAGNOSIS — D0512 Intraductal carcinoma in situ of left breast: Secondary | ICD-10-CM | POA: Diagnosis not present

## 2018-02-10 ENCOUNTER — Ambulatory Visit
Admission: RE | Admit: 2018-02-10 | Discharge: 2018-02-10 | Disposition: A | Discharge status: Home / Self Care | Payer: BLUE CROSS/BLUE SHIELD | Source: Ambulatory Visit | Attending: Radiation Oncology | Admitting: Radiation Oncology

## 2018-02-10 DIAGNOSIS — D0512 Intraductal carcinoma in situ of left breast: Secondary | ICD-10-CM | POA: Diagnosis not present

## 2018-02-13 ENCOUNTER — Ambulatory Visit
Admission: RE | Admit: 2018-02-13 | Discharge: 2018-02-13 | Disposition: A | Discharge status: Home / Self Care | Payer: BLUE CROSS/BLUE SHIELD | Source: Ambulatory Visit | Attending: Radiation Oncology | Admitting: Radiation Oncology

## 2018-02-13 DIAGNOSIS — D0512 Intraductal carcinoma in situ of left breast: Secondary | ICD-10-CM | POA: Diagnosis not present

## 2018-02-14 ENCOUNTER — Ambulatory Visit: Payer: BLUE CROSS/BLUE SHIELD

## 2018-02-15 ENCOUNTER — Ambulatory Visit
Admission: RE | Admit: 2018-02-15 | Discharge: 2018-02-15 | Disposition: A | Discharge status: Home / Self Care | Payer: BLUE CROSS/BLUE SHIELD | Source: Ambulatory Visit | Attending: Radiation Oncology | Admitting: Radiation Oncology

## 2018-02-15 DIAGNOSIS — D0512 Intraductal carcinoma in situ of left breast: Secondary | ICD-10-CM | POA: Diagnosis not present

## 2018-02-16 ENCOUNTER — Ambulatory Visit
Admission: RE | Admit: 2018-02-16 | Discharge: 2018-02-16 | Disposition: A | Discharge status: Home / Self Care | Payer: BLUE CROSS/BLUE SHIELD | Source: Ambulatory Visit | Attending: Radiation Oncology | Admitting: Radiation Oncology

## 2018-02-16 DIAGNOSIS — D0512 Intraductal carcinoma in situ of left breast: Secondary | ICD-10-CM | POA: Diagnosis not present

## 2018-02-17 ENCOUNTER — Ambulatory Visit
Admission: RE | Admit: 2018-02-17 | Discharge: 2018-02-17 | Disposition: A | Discharge status: Home / Self Care | Payer: BLUE CROSS/BLUE SHIELD | Source: Ambulatory Visit | Attending: Radiation Oncology | Admitting: Radiation Oncology

## 2018-02-17 DIAGNOSIS — D0512 Intraductal carcinoma in situ of left breast: Secondary | ICD-10-CM | POA: Diagnosis not present

## 2018-02-20 ENCOUNTER — Ambulatory Visit
Admission: RE | Admit: 2018-02-20 | Discharge: 2018-02-20 | Disposition: A | Discharge status: Home / Self Care | Payer: BLUE CROSS/BLUE SHIELD | Source: Ambulatory Visit | Attending: Radiation Oncology | Admitting: Radiation Oncology

## 2018-02-20 DIAGNOSIS — D0512 Intraductal carcinoma in situ of left breast: Secondary | ICD-10-CM | POA: Diagnosis not present

## 2018-02-21 ENCOUNTER — Ambulatory Visit: Payer: BLUE CROSS/BLUE SHIELD

## 2018-02-21 ENCOUNTER — Inpatient Hospital Stay: Payer: BLUE CROSS/BLUE SHIELD | Attending: Radiation Oncology

## 2018-02-21 ENCOUNTER — Ambulatory Visit
Admission: RE | Admit: 2018-02-21 | Discharge: 2018-02-21 | Disposition: A | Discharge status: Home / Self Care | Payer: BLUE CROSS/BLUE SHIELD | Source: Ambulatory Visit | Attending: Radiation Oncology | Admitting: Radiation Oncology

## 2018-02-21 DIAGNOSIS — D0512 Intraductal carcinoma in situ of left breast: Secondary | ICD-10-CM | POA: Diagnosis not present

## 2018-02-21 LAB — CBC
HCT: 37 % (ref 36.0–46.0)
Hemoglobin: 12 g/dL (ref 12.0–15.0)
MCH: 29.5 pg (ref 26.0–34.0)
MCHC: 32.4 g/dL (ref 30.0–36.0)
MCV: 90.9 fL (ref 80.0–100.0)
Platelets: 314 10*3/uL (ref 150–400)
RBC: 4.07 MIL/uL (ref 3.87–5.11)
RDW: 12.5 % (ref 11.5–15.5)
WBC: 7.1 10*3/uL (ref 4.0–10.5)
nRBC: 0 % (ref 0.0–0.2)

## 2018-02-22 ENCOUNTER — Ambulatory Visit
Admission: RE | Admit: 2018-02-22 | Discharge: 2018-02-22 | Disposition: A | Discharge status: Home / Self Care | Payer: BLUE CROSS/BLUE SHIELD | Source: Ambulatory Visit | Attending: Radiation Oncology | Admitting: Radiation Oncology

## 2018-02-22 DIAGNOSIS — D0512 Intraductal carcinoma in situ of left breast: Secondary | ICD-10-CM | POA: Diagnosis not present

## 2018-02-23 ENCOUNTER — Inpatient Hospital Stay: Payer: BLUE CROSS/BLUE SHIELD

## 2018-02-23 ENCOUNTER — Ambulatory Visit
Admission: RE | Admit: 2018-02-23 | Discharge: 2018-02-23 | Disposition: A | Discharge status: Home / Self Care | Payer: BLUE CROSS/BLUE SHIELD | Source: Ambulatory Visit | Attending: Radiation Oncology | Admitting: Radiation Oncology

## 2018-02-23 DIAGNOSIS — D0512 Intraductal carcinoma in situ of left breast: Secondary | ICD-10-CM | POA: Diagnosis not present

## 2018-02-24 ENCOUNTER — Ambulatory Visit
Admission: RE | Admit: 2018-02-24 | Discharge: 2018-02-24 | Disposition: A | Discharge status: Home / Self Care | Payer: BLUE CROSS/BLUE SHIELD | Source: Ambulatory Visit | Attending: Radiation Oncology | Admitting: Radiation Oncology

## 2018-02-24 DIAGNOSIS — D0512 Intraductal carcinoma in situ of left breast: Secondary | ICD-10-CM | POA: Diagnosis not present

## 2018-02-27 ENCOUNTER — Ambulatory Visit
Admission: RE | Admit: 2018-02-27 | Discharge: 2018-02-27 | Disposition: A | Discharge status: Home / Self Care | Payer: BLUE CROSS/BLUE SHIELD | Source: Ambulatory Visit | Attending: Radiation Oncology | Admitting: Radiation Oncology

## 2018-02-27 DIAGNOSIS — D0512 Intraductal carcinoma in situ of left breast: Secondary | ICD-10-CM | POA: Diagnosis not present

## 2018-02-28 ENCOUNTER — Ambulatory Visit
Admission: RE | Admit: 2018-02-28 | Discharge: 2018-02-28 | Disposition: A | Discharge status: Home / Self Care | Payer: BLUE CROSS/BLUE SHIELD | Source: Ambulatory Visit | Attending: Radiation Oncology | Admitting: Radiation Oncology

## 2018-02-28 DIAGNOSIS — D0512 Intraductal carcinoma in situ of left breast: Secondary | ICD-10-CM | POA: Diagnosis not present

## 2018-03-02 ENCOUNTER — Ambulatory Visit
Admission: RE | Admit: 2018-03-02 | Discharge: 2018-03-02 | Disposition: A | Discharge status: Home / Self Care | Payer: BLUE CROSS/BLUE SHIELD | Source: Ambulatory Visit | Attending: Radiation Oncology | Admitting: Radiation Oncology

## 2018-03-02 DIAGNOSIS — D0512 Intraductal carcinoma in situ of left breast: Secondary | ICD-10-CM | POA: Diagnosis not present

## 2018-03-03 ENCOUNTER — Ambulatory Visit
Admission: RE | Admit: 2018-03-03 | Discharge: 2018-03-03 | Disposition: A | Discharge status: Home / Self Care | Payer: BLUE CROSS/BLUE SHIELD | Source: Ambulatory Visit | Attending: Radiation Oncology | Admitting: Radiation Oncology

## 2018-03-03 DIAGNOSIS — D0512 Intraductal carcinoma in situ of left breast: Secondary | ICD-10-CM | POA: Diagnosis not present

## 2018-03-06 ENCOUNTER — Ambulatory Visit
Admission: RE | Admit: 2018-03-06 | Discharge: 2018-03-06 | Disposition: A | Discharge status: Home / Self Care | Payer: BLUE CROSS/BLUE SHIELD | Source: Ambulatory Visit | Attending: Radiation Oncology | Admitting: Radiation Oncology

## 2018-03-06 DIAGNOSIS — D0512 Intraductal carcinoma in situ of left breast: Secondary | ICD-10-CM | POA: Diagnosis not present

## 2018-03-07 ENCOUNTER — Other Ambulatory Visit: Payer: Self-pay

## 2018-03-07 ENCOUNTER — Ambulatory Visit
Admission: RE | Admit: 2018-03-07 | Discharge: 2018-03-07 | Disposition: A | Discharge status: Home / Self Care | Payer: BLUE CROSS/BLUE SHIELD | Source: Ambulatory Visit | Attending: Radiation Oncology | Admitting: Radiation Oncology

## 2018-03-07 ENCOUNTER — Inpatient Hospital Stay: Payer: BLUE CROSS/BLUE SHIELD

## 2018-03-07 DIAGNOSIS — D0512 Intraductal carcinoma in situ of left breast: Secondary | ICD-10-CM | POA: Diagnosis not present

## 2018-03-07 LAB — CBC
HCT: 38.2 % (ref 36.0–46.0)
HEMOGLOBIN: 12.3 g/dL (ref 12.0–15.0)
MCH: 29.2 pg (ref 26.0–34.0)
MCHC: 32.2 g/dL (ref 30.0–36.0)
MCV: 90.7 fL (ref 80.0–100.0)
NRBC: 0 % (ref 0.0–0.2)
Platelets: 298 10*3/uL (ref 150–400)
RBC: 4.21 MIL/uL (ref 3.87–5.11)
RDW: 12.7 % (ref 11.5–15.5)
WBC: 7.7 10*3/uL (ref 4.0–10.5)

## 2018-03-09 ENCOUNTER — Ambulatory Visit
Admission: RE | Admit: 2018-03-09 | Discharge: 2018-03-09 | Disposition: A | Discharge status: Home / Self Care | Payer: BLUE CROSS/BLUE SHIELD | Source: Ambulatory Visit | Attending: Radiation Oncology | Admitting: Radiation Oncology

## 2018-03-09 ENCOUNTER — Inpatient Hospital Stay: Payer: BLUE CROSS/BLUE SHIELD

## 2018-03-09 DIAGNOSIS — D0512 Intraductal carcinoma in situ of left breast: Secondary | ICD-10-CM | POA: Diagnosis not present

## 2018-03-10 ENCOUNTER — Ambulatory Visit
Admission: RE | Admit: 2018-03-10 | Discharge: 2018-03-10 | Disposition: A | Discharge status: Home / Self Care | Payer: BLUE CROSS/BLUE SHIELD | Source: Ambulatory Visit | Attending: Radiation Oncology | Admitting: Radiation Oncology

## 2018-03-10 DIAGNOSIS — D0512 Intraductal carcinoma in situ of left breast: Secondary | ICD-10-CM | POA: Diagnosis not present

## 2018-03-13 ENCOUNTER — Ambulatory Visit
Admission: RE | Admit: 2018-03-13 | Discharge: 2018-03-13 | Disposition: A | Discharge status: Home / Self Care | Payer: BLUE CROSS/BLUE SHIELD | Source: Ambulatory Visit | Attending: Radiation Oncology | Admitting: Radiation Oncology

## 2018-03-13 DIAGNOSIS — D0512 Intraductal carcinoma in situ of left breast: Secondary | ICD-10-CM | POA: Diagnosis not present

## 2018-03-14 ENCOUNTER — Ambulatory Visit
Admission: RE | Admit: 2018-03-14 | Discharge: 2018-03-14 | Disposition: A | Discharge status: Home / Self Care | Payer: BLUE CROSS/BLUE SHIELD | Source: Ambulatory Visit | Attending: Radiation Oncology | Admitting: Radiation Oncology

## 2018-03-14 DIAGNOSIS — D0512 Intraductal carcinoma in situ of left breast: Secondary | ICD-10-CM | POA: Diagnosis not present

## 2018-03-15 ENCOUNTER — Ambulatory Visit
Admission: RE | Admit: 2018-03-15 | Discharge: 2018-03-15 | Disposition: A | Discharge status: Home / Self Care | Payer: BLUE CROSS/BLUE SHIELD | Source: Ambulatory Visit | Attending: Radiation Oncology | Admitting: Radiation Oncology

## 2018-03-15 ENCOUNTER — Ambulatory Visit: Payer: BLUE CROSS/BLUE SHIELD

## 2018-03-15 DIAGNOSIS — D0512 Intraductal carcinoma in situ of left breast: Secondary | ICD-10-CM | POA: Diagnosis not present

## 2018-03-16 ENCOUNTER — Ambulatory Visit
Admission: RE | Admit: 2018-03-16 | Discharge: 2018-03-16 | Disposition: A | Discharge status: Home / Self Care | Payer: BLUE CROSS/BLUE SHIELD | Source: Ambulatory Visit | Attending: Radiation Oncology | Admitting: Radiation Oncology

## 2018-03-16 DIAGNOSIS — D0512 Intraductal carcinoma in situ of left breast: Secondary | ICD-10-CM | POA: Diagnosis not present

## 2018-03-22 ENCOUNTER — Encounter: Payer: Self-pay | Admitting: Radiation Oncology

## 2018-04-24 ENCOUNTER — Ambulatory Visit: Payer: Self-pay | Admitting: Radiation Oncology

## 2018-05-01 ENCOUNTER — Other Ambulatory Visit: Payer: Self-pay

## 2018-05-01 ENCOUNTER — Ambulatory Visit
Admission: RE | Admit: 2018-05-01 | Discharge: 2018-05-01 | Disposition: A | Discharge status: Home / Self Care | Payer: BLUE CROSS/BLUE SHIELD | Source: Ambulatory Visit | Attending: Radiation Oncology | Admitting: Radiation Oncology

## 2018-05-01 ENCOUNTER — Encounter: Payer: Self-pay | Admitting: Radiation Oncology

## 2018-05-01 VITALS — BP 142/82 | Temp 98.0°F | Resp 18 | Wt 262.1 lb

## 2018-05-01 DIAGNOSIS — D0512 Intraductal carcinoma in situ of left breast: Secondary | ICD-10-CM | POA: Insufficient documentation

## 2018-05-01 NOTE — Progress Notes (Signed)
Radiation Oncology Follow up Note  Name: Shannon Allen   Date:   05/01/2018 MRN:  [id number] DOB: [birth year]    This 43 y.o. female presents to the clinic today for one-month follow-up status post whole breast radiation to her left breast for stage I invasive mammary carcinoma ER/PR positive the left breast status post wide local.  REFERRING PROVIDER: Wayland Denis, PA-C  HPI: patient is a 43 year old female now 1 month out having completed whole breast radiation to her left breast for stage I (T1 BN 0 M0) ER/PR positive invasive mammary carcinoma status post wide local excision and sentinel node biopsy. Seen today in routine follow-up she is doing fairly well. She states she is having some trouble her blood sugarI've assured her is nothing to do with radiation. She's also having some arthritic type pains although has recently started.tamoxifen which may be the etiology.  COMPLICATIONS OF TREATMENT: none  FOLLOW UP COMPLIANCE: keeps appointments   PHYSICAL EXAM:  BP (!) 142/82 (BP Location: Right Arm, Patient Position: Sitting)   Temp 98 F (36.7 C) (Tympanic)   Resp 18   Wt 262 lb 2 oz (118.9 kg)   BMI 47.94 kg/m  Lungs are clear to A&P cardiac examination essentially unremarkable with regular rate and rhythm. No dominant mass or nodularity is noted in either breast in 2 positions examined. Incision is well-healed. No axillary or supraclavicular adenopathy is appreciated. Cosmetic result is excellent.Well-developed well-nourished patient in NAD. HEENT reveals PERLA, EOMI, discs not visualized.  Oral cavity is clear. No oral mucosal lesions are identified. Neck is clear without evidence of cervical or supraclavicular adenopathy. Lungs are clear to A&P. Cardiac examination is essentially unremarkable with regular rate and rhythm without murmur rub or thrill. Abdomen is benign with no organomegaly or masses noted. Motor sensory and DTR levels are equal and symmetric in the upper and lower  extremities. Cranial nerves II through XII are grossly intact. Proprioception is intact. No peripheral adenopathy or edema is identified. No motor or sensory levels are noted. Crude visual fields are within normal range.  RADIOLOGY RESULTS: no current films for review  PLAN: present time patient is doing well 1 month out. She continues follow-up care with herPMD for her blood sugar. I've also asked her to address with oncology the issues with arthritis may be related to her tamoxifen. Otherwise I'm please were overall progress. I have asked to see her back in 4-5 months for follow-up. Patient is to call sooner with any concerns.  I would like to take this opportunity to thank you for allowing me to participate in the care of your patient.Noreene Filbert, MD

## 2018-06-10 IMAGING — MG MM BREAST SURGICAL SPECIMEN
1 series · 1 of 1 positions shown · non-contrast
Comparison: Previous exam(s).

CLINICAL DATA: Status post left breast surgery.

EXAM:
SPECIMEN RADIOGRAPH OF THE LEFT BREAST

[L SPECIMEN]
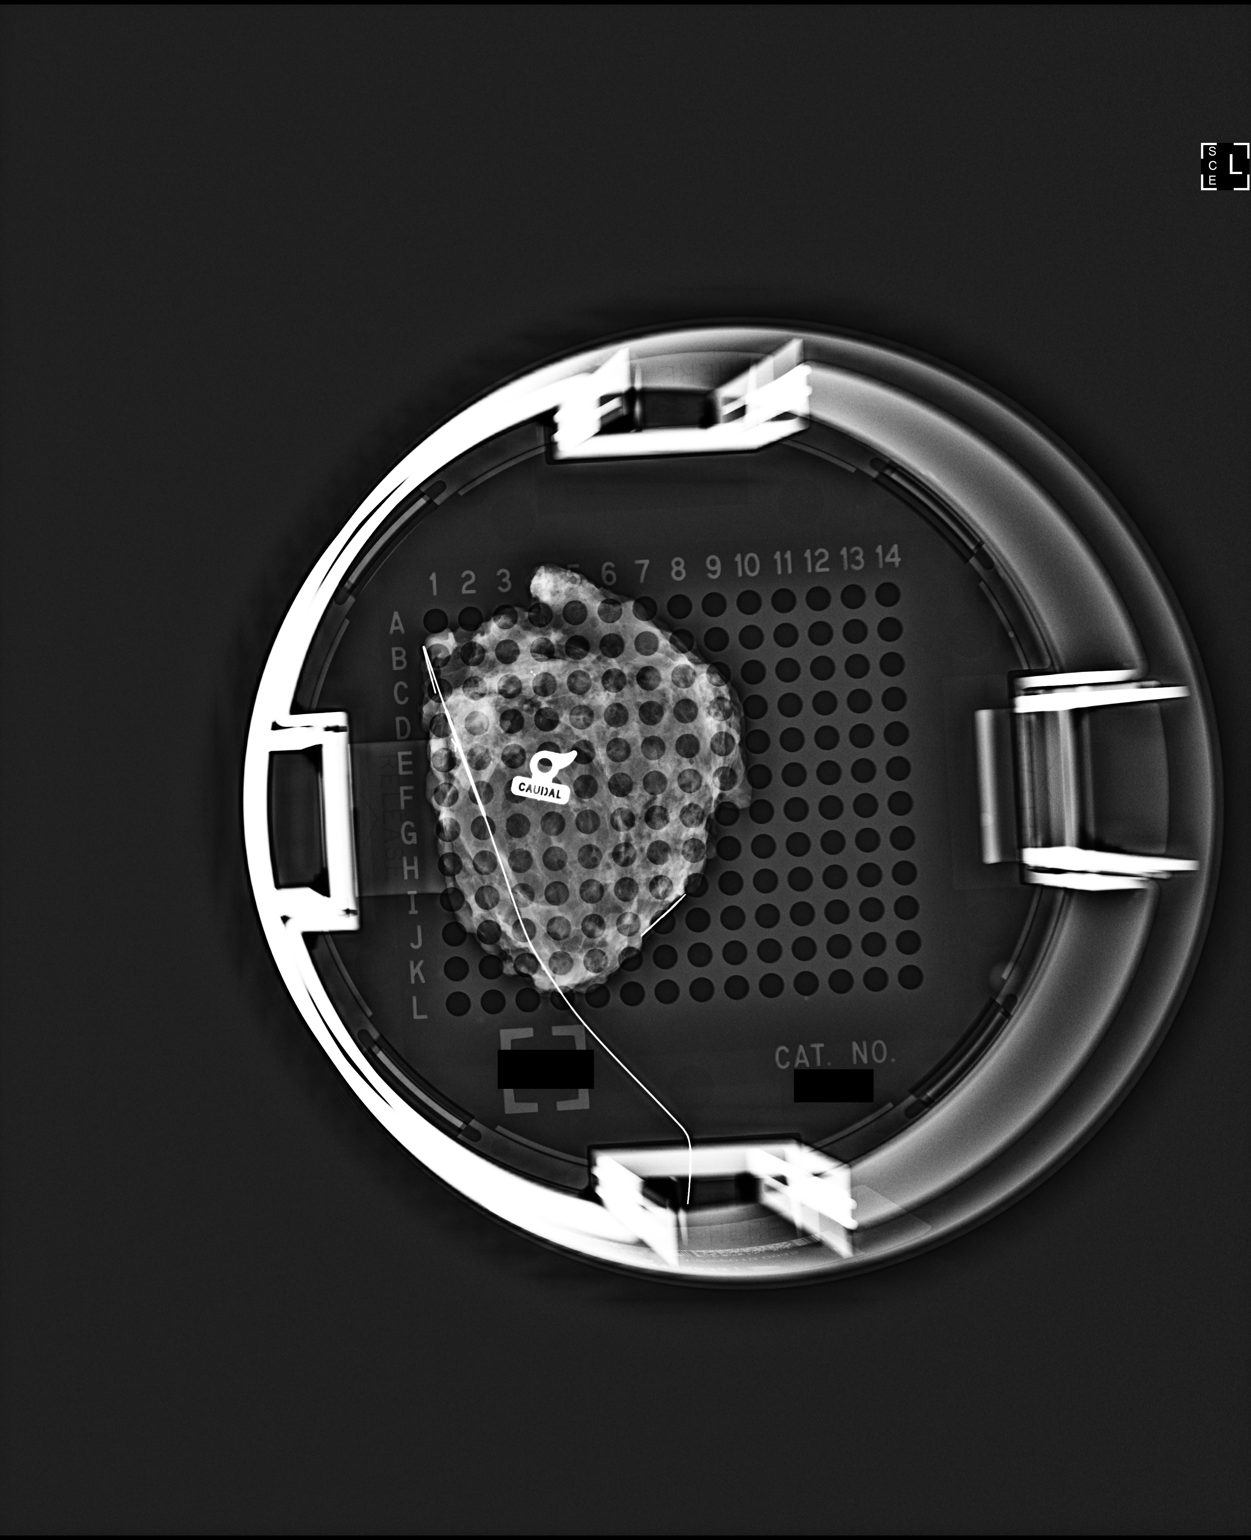

[1 of 1 positions shown; findings below may reference images not displayed]

FINDINGS: Status post excision of the left breast. The wire tip and biopsy
marker clip and surrounding calcifications are present and are
marked for pathology.
IMPRESSION: Specimen radiograph of the left breast.

## 2018-06-10 IMAGING — MG MM PLC BREAST LOC DEV 1ST LESION INC MAMMO GUIDE*L*
5 series · 5 of 5 positions shown · non-contrast
Comparison: Previous exams.

CLINICAL DATA: Left breast atypical intraductal proliferation for
surgical excision.

EXAM:
NEEDLE LOCALIZATION OF THE LEFT BREAST WITH MAMMO GUIDANCE

[L CC (1 of 2)]
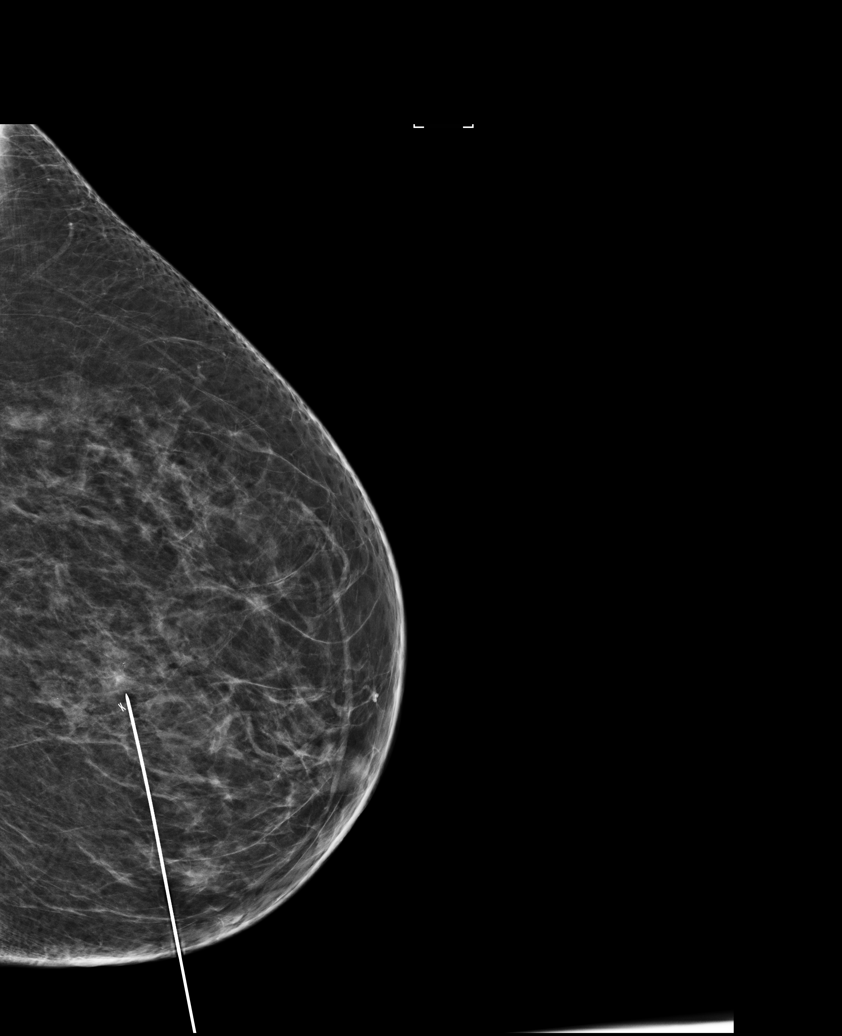

[L ML (1 of 3)]
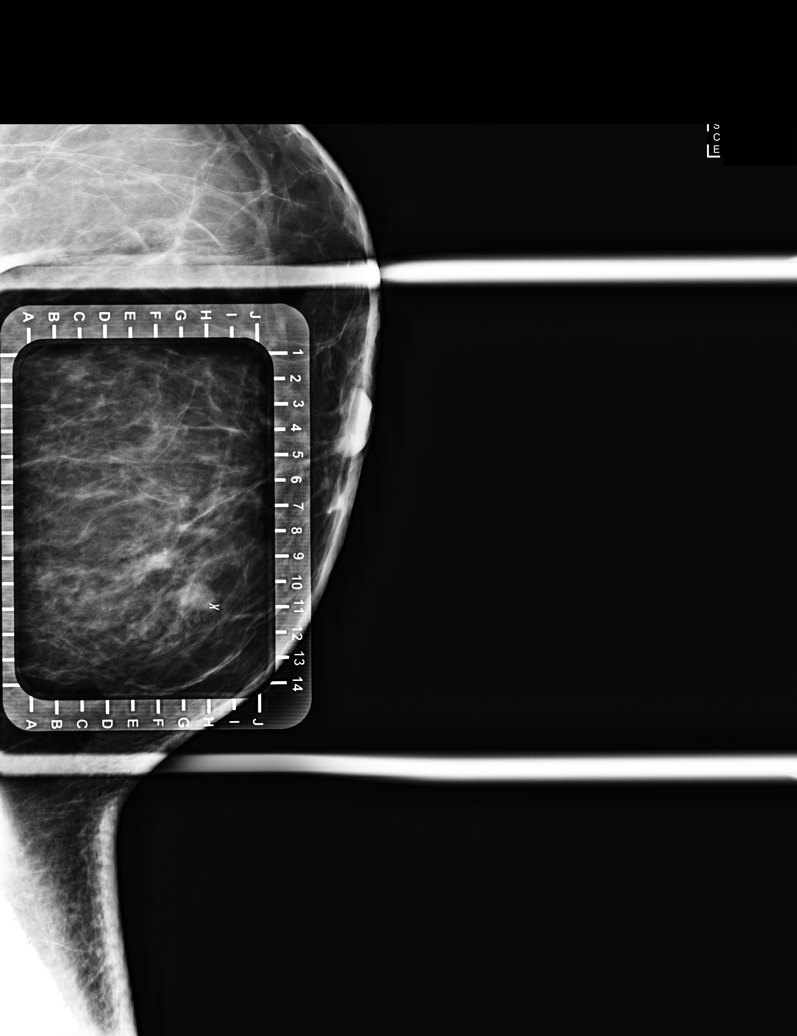

[L CC (2 of 2)]
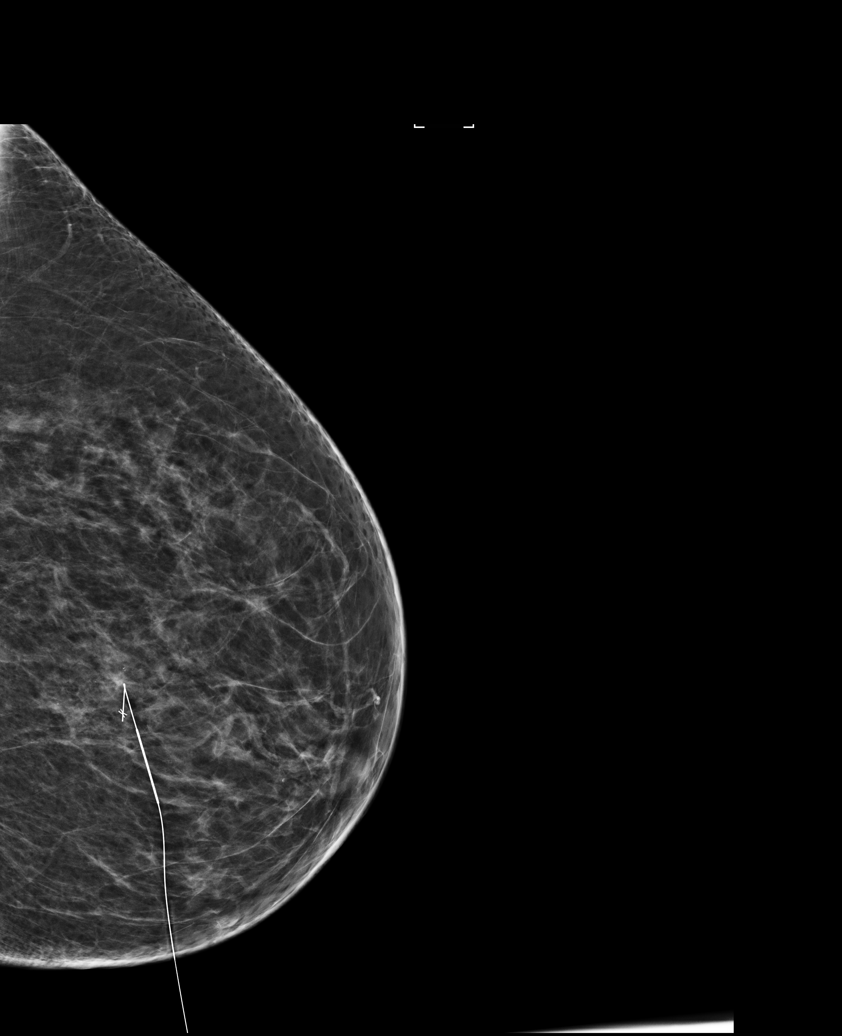

[L ML (2 of 3)]
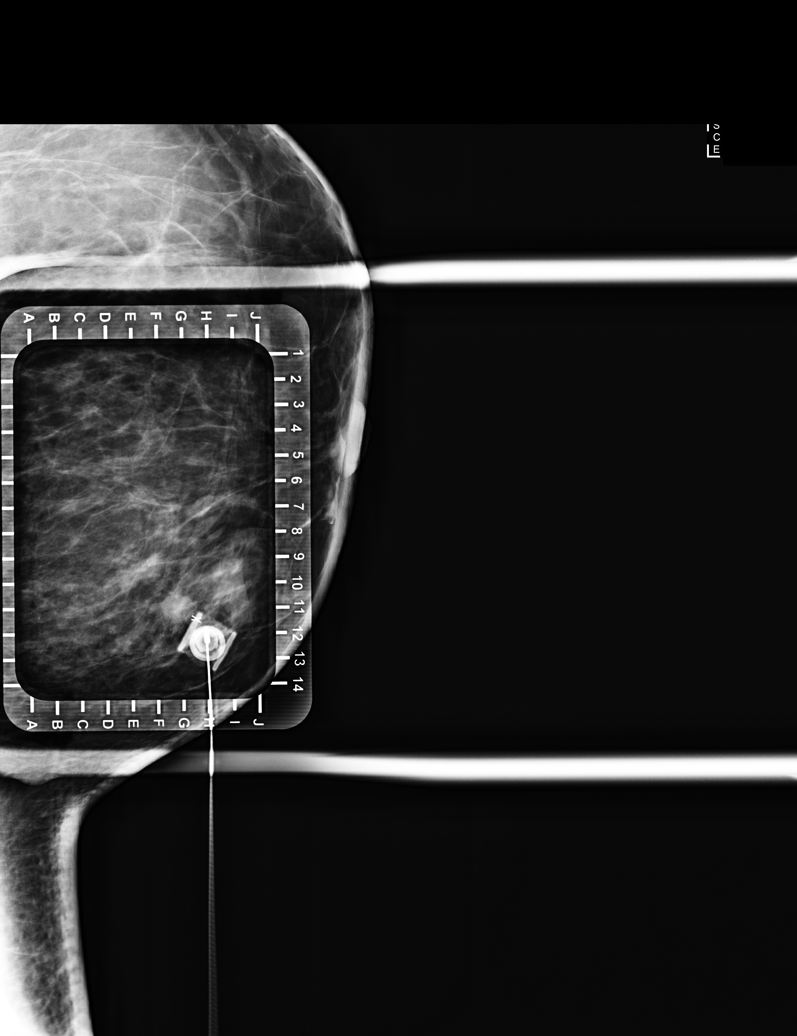

[L ML (3 of 3)]
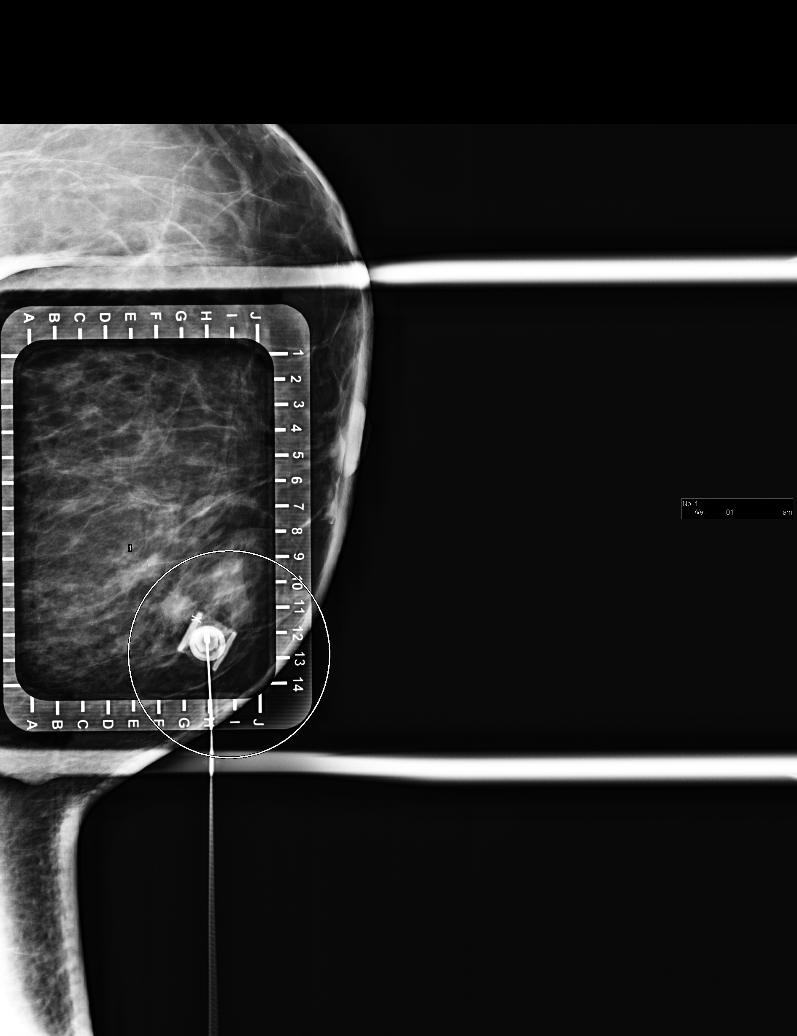

[5 of 5 positions shown; findings below may reference images not displayed]

FINDINGS: Patient presents for needle localization prior to left breast
surgery. I met with the patient and we discussed the procedure of
needle localization including benefits and alternatives. We
discussed the high likelihood of a successful procedure. We
discussed the risks of the procedure, including infection, bleeding,
tissue injury, and further surgery. Informed, written consent was
given. The usual time-out protocol was performed immediately prior
to the procedure.

Using mammographic guidance, sterile technique, 1% lidocaine and a
15 cm modified Kopans needle, biopsy clip localized using medial
approach. The images were marked for Dr. Yoselin.
IMPRESSION: Needle localization left breast. No apparent complications.

## 2018-09-20 ENCOUNTER — Encounter: Payer: Self-pay | Admitting: Radiation Oncology

## 2018-09-25 ENCOUNTER — Ambulatory Visit: Payer: BC Managed Care – PPO | Admitting: Radiation Oncology

## 2018-10-04 ENCOUNTER — Other Ambulatory Visit: Payer: Self-pay

## 2018-10-05 ENCOUNTER — Ambulatory Visit
Admission: RE | Admit: 2018-10-05 | Discharge: 2018-10-05 | Disposition: A | Discharge status: Home / Self Care | Payer: BC Managed Care – PPO | Source: Ambulatory Visit | Attending: Radiation Oncology | Admitting: Radiation Oncology

## 2018-10-05 ENCOUNTER — Encounter: Payer: Self-pay | Admitting: Radiation Oncology

## 2018-10-05 ENCOUNTER — Other Ambulatory Visit: Payer: Self-pay

## 2018-10-05 VITALS — BP 122/78 | Temp 97.3°F | Resp 18 | Wt 259.7 lb

## 2018-10-05 DIAGNOSIS — Z79810 Long term (current) use of selective estrogen receptor modulators (SERMs): Secondary | ICD-10-CM | POA: Diagnosis not present

## 2018-10-05 DIAGNOSIS — Z170 Estrogen receptor positive status [ER+]: Secondary | ICD-10-CM | POA: Insufficient documentation

## 2018-10-05 DIAGNOSIS — D0512 Intraductal carcinoma in situ of left breast: Secondary | ICD-10-CM | POA: Insufficient documentation

## 2018-10-05 DIAGNOSIS — Z923 Personal history of irradiation: Secondary | ICD-10-CM | POA: Diagnosis not present

## 2018-10-05 NOTE — Progress Notes (Signed)
Radiation Oncology Follow up Note  Name: Shannon Allen   Date:   10/05/2018 MRN:  [id number] DOB: November 30, 1975    This 43 y.o. 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  REFERRING PROVIDER: Wayland Denis, PA-C  HPI: Patient is a 43 year old female now about 6 months having completed whole breast radiation to her left breast for stage I ER PR positive invasive mammary carcinoma.  Seen today in routine follow-up she is doing well she specifically denies breast tenderness cough or bone pain..  She had a mammogram at [facility] in March showing BI-RADS 2 benign findings.  She is currently on tamoxifen having some joint stiffness although otherwise tolerating well.  COMPLICATIONS OF TREATMENT: none  FOLLOW UP COMPLIANCE: keeps appointments   PHYSICAL EXAM:  BP 122/78   Temp (!) 97.3 F (36.3 C)   Resp 18   Wt 259 lb 11.2 oz (117.8 kg)   BMI 47.50 kg/m  Lungs are clear to A&P cardiac examination essentially unremarkable with regular rate and rhythm. No dominant mass or nodularity is noted in either breast in 2 positions examined. Incision is well-healed. No axillary or supraclavicular adenopathy is appreciated. Cosmetic result is excellent.  Well-developed well-nourished patient in NAD. HEENT reveals PERLA, EOMI, discs not visualized.  Oral cavity is clear. No oral mucosal lesions are identified. Neck is clear without evidence of cervical or supraclavicular adenopathy. Lungs are clear to A&P. Cardiac examination is essentially unremarkable with regular rate and rhythm without murmur rub or thrill. Abdomen is benign with no organomegaly or masses noted. Motor sensory and DTR levels are equal and symmetric in the upper and lower extremities. Cranial nerves II through XII are grossly intact. Proprioception is intact. No peripheral adenopathy or edema is identified. No motor or  sensory levels are noted. Crude visual fields are within normal range.  RADIOLOGY RESULTS: Mammograms are requested for my review reports are reviewed.  PLAN: Present time she is doing well 6 months out with no evidence of disease.  I am pleased with her overall progress.  I have asked to see her back in 6 months and then will start once a year follow-up visits.  Patient is to call with any concerns at any time.  She continues on tamoxifen.  I would like to take this opportunity to thank you for allowing me to participate in the care of your patient.Noreene Filbert, MD

## 2019-03-06 ENCOUNTER — Other Ambulatory Visit: Payer: Self-pay

## 2019-03-07 ENCOUNTER — Encounter: Payer: Self-pay | Admitting: Radiation Oncology

## 2019-03-07 ENCOUNTER — Other Ambulatory Visit: Payer: Self-pay

## 2019-03-07 ENCOUNTER — Ambulatory Visit
Admission: RE | Admit: 2019-03-07 | Discharge: 2019-03-07 | Disposition: A | Discharge status: Home / Self Care | Payer: BC Managed Care – PPO | Source: Ambulatory Visit | Attending: Radiation Oncology | Admitting: Radiation Oncology

## 2019-03-07 DIAGNOSIS — Z923 Personal history of irradiation: Secondary | ICD-10-CM | POA: Insufficient documentation

## 2019-03-07 DIAGNOSIS — D0512 Intraductal carcinoma in situ of left breast: Secondary | ICD-10-CM | POA: Diagnosis not present

## 2019-03-07 DIAGNOSIS — Z79810 Long term (current) use of selective estrogen receptor modulators (SERMs): Secondary | ICD-10-CM | POA: Diagnosis not present

## 2019-03-07 DIAGNOSIS — Z170 Estrogen receptor positive status [ER+]: Secondary | ICD-10-CM | POA: Diagnosis not present

## 2019-03-07 NOTE — Progress Notes (Signed)
Radiation Oncology Follow up Note  Name: Shannon Allen   Date:   03/07/2019 MRN:  [id number] DOB: Jan 04, 1976    This 43 y.o. female presents to the clinic today for 1 year follow-up status post whole breast radiation to her left breast for stage I ER/PR positive invasive mammary carcinoma.  REFERRING PROVIDER: Wayland Denis, PA-C  HPI: Patient is a 43 year old female now out 1 year having completed whole breast radiation to her left breast for stage I ER/PR positive invasive mammary carcinoma.  Seen today in routine follow-up she is doing well.  She specifically denies breast tenderness cough or bone pain..  She had mammograms back in March which were BI-RADS 2 benign.  She is scheduled for mammograms next week which I will review when the become available.  She is currently on tamoxifen tolerating that well without side effect.  COMPLICATIONS OF TREATMENT: none  FOLLOW UP COMPLIANCE: keeps appointments   PHYSICAL EXAM:  BP (P) 132/88 (BP Location: Left Arm, Patient Position: Sitting)   Temp (!) (P) 97.5 F (36.4 C) (Tympanic)   Resp (P) 18   Wt (P) 255 lb (115.7 kg)   BMI (P) 46.64 kg/m  Lungs are clear to A&P cardiac examination essentially unremarkable with regular rate and rhythm. No dominant mass or nodularity is noted in either breast in 2 positions examined. Incision is well-healed. No axillary or supraclavicular adenopathy is appreciated. Cosmetic result is excellent.  Well-developed well-nourished patient in NAD. HEENT reveals PERLA, EOMI, discs not visualized.  Oral cavity is clear. No oral mucosal lesions are identified. Neck is clear without evidence of cervical or supraclavicular adenopathy. Lungs are clear to A&P. Cardiac examination is essentially unremarkable with regular rate and rhythm without murmur rub or thrill. Abdomen is benign with no organomegaly or masses noted. Motor sensory and DTR levels are equal and symmetric in the upper and lower extremities. Cranial  nerves II through XII are grossly intact. Proprioception is intact. No peripheral adenopathy or edema is identified. No motor or sensory levels are noted. Crude visual fields are within normal range.  RADIOLOGY RESULTS: Mammogram reports are reviewed and compatible with above-stated findings mammograms from next week will also be reviewed and I will request those films my own personal review.  PLAN: Present time patient is doing well with no evidence of disease 1 year out and pleased with her overall progress.  She continues on tamoxifen without side effect.  Of asked to see her back in 1 year for follow-up.  Patient knows to call with any concerns at any time.  I would like to take this opportunity to thank you for allowing me to participate in the care of your patient.Noreene Filbert, MD

## 2019-08-07 ENCOUNTER — Ambulatory Visit: Payer: BC Managed Care – PPO | Admitting: Emergency Medicine

## 2019-08-07 ENCOUNTER — Other Ambulatory Visit: Payer: Self-pay

## 2019-08-07 ENCOUNTER — Encounter: Payer: Self-pay | Admitting: Emergency Medicine

## 2019-08-07 VITALS — BP 125/71 | HR 76 | Temp 98.7°F | Resp 16 | Ht 62.5 in | Wt 254.0 lb

## 2019-08-07 DIAGNOSIS — J302 Other seasonal allergic rhinitis: Secondary | ICD-10-CM

## 2019-08-07 DIAGNOSIS — H6983 Other specified disorders of Eustachian tube, bilateral: Secondary | ICD-10-CM

## 2019-08-07 MED ORDER — PREDNISONE 10 MG PO TABS: ORAL_TABLET | ORAL | 0 refills | Status: AC

## 2019-08-07 MED ORDER — BENZONATATE 200 MG PO CAPS: 200.0000 mg | ORAL_CAPSULE | Freq: Three times a day (TID) | ORAL | 0 refills | Status: DC | PRN

## 2019-08-07 NOTE — Progress Notes (Signed)
I have reviewed the triage vital signs and the nursing notes.   HISTORY  Chief Complaint Cough (2-3x days with cough and drainage started 08/02/19 mucous is light yellow, no fever.) and Ear Pain (right ear pain 3x days)   HPI Shannon Allen is a 44 y.o. female presents to clinic with complaint of cough for the last 2 to 3 days and posterior drainage that is usually part of her seasonal allergies.  Patient also has had some right ear pain for the last 3 days.  She currently is taking her antihistamine along with Singulair.  She is not used her Flonase nasal spray.        Past Medical History:  Diagnosis Date  . ADHD (attention deficit hyperactivity disorder)   . 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 Breast cancer ([place]) 03/2017  . Depression   . Endometriosis determined by laparoscopy 2005  . Genetic testing 05/12/2017   Breast/GYN panel (23 genes) @ [facility] - No pathogenic mutations detected  . GERD (gastroesophageal reflux disease)   . Headache   . History of kidney stones   . 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 Malignant neoplasm of left breast in female, estrogen receptor positive ([place]) 01/11/2018  . Endometriosis 10/11/2017  . External hemorrhoids 10/11/2017  . 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 Morbid obesity with BMI of 45.0-49.9, adult ([place]) 10/11/2017  . Genetic testing 05/12/2017  . 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 Dysmetabolic syndrome [id number]  . Allergic rhinitis 02/18/2015  . Asthma 02/18/2015  . Lumbago 02/18/2015  . ADHD (attention deficit hyperactivity disorder) 02/18/2015  . Class 3 severe obesity due to excess calories without serious comorbidity with body mass index (BMI) of 40.0 to 44.9 in adult [facility]) 02/18/2015    Past Surgical History:  Procedure Laterality Date  . BREAST BIOPSY Left 03/09/2017   left breast stereo path ATYPICAL INTRADUCTAL PROLIFERATION   . BREAST BIOPSY Left 04/01/2017   Procedure: BREAST BIOPSY WITH NEEDLE LOCALIZATION;  Surgeon: [name], MD;  Location: [facility] ORS;  Service: General;  Laterality: Left;  . BREAST EXCISIONAL BIOPSY Left 04/01/2017   lumpectomy done by Dr. Bary Castilla  . laproscopic surgery  2005  . TONSILLECTOMY      Prior to Admission medications   Medication Sig Start Date End Date Taking? Authorizing Provider  ALPRAZolam Duanne Moron) 0.5 MG tablet Take 1 tablet (0.5 mg total) by mouth 2 (two) times daily as needed for anxiety. Patient taking differently: Take 0.5 mg by mouth 2 (two) times daily as needed for anxiety (For surgery).  03/02/17  Yes Gae Dry, MD  BIOTIN PO Take by mouth.   Yes [provider]  cetirizine (ZYRTEC) 10 MG tablet Take 10 mg by mouth at bedtime.    Yes [provider]  diclofenac (CATAFLAM) 50 MG tablet Take 1 tablet (50 mg total) by mouth 2 (two) times daily. Patient taking differently: Take 50 mg by mouth 2 (two) times daily as needed (for pain/cramps).  11/30/16  Yes Gae Dry, MD  ELDERBERRY PO Take by mouth.   Yes [provider]  fluticasone (FLONASE) 50 MCG/ACT nasal spray Place 2 sprays into both nostrils daily as needed (for allergies.).    Yes [provider]  levonorgestrel (MIRENA) 20 MCG/24HR IUD 1 each by Intrauterine route  once.   Yes [provider]  montelukast (SINGULAIR) 10 MG tablet TAKE 1 TABLET BY MOUTH  NIGHTLY 02/21/19  Yes [provider]  tamoxifen (NOLVADEX) 20 MG tablet Take 20 mg by mouth daily. 03/15/19  Yes [provider]  vitamin B-12 (CYANOCOBALAMIN) 500 MCG tablet Take 500 mcg by mouth daily.   Yes [provider]  benzonatate (TESSALON) 200 MG capsule Take 1 capsule (200 mg total) by mouth 3 (three) times daily as needed for cough. 08/07/19   Johnn Hai, PA-C  ibuprofen (ADVIL,MOTRIN) 200 MG tablet  Take 400-600 mg by mouth every 8 (eight) hours as needed (for pain.).    [provider]  Levonorgestrel-Ethinyl Estradiol (AMETHIA,CAMRESE) 0.15-0.03 &0.01 MG tablet Take 1 tablet by mouth daily. Patient not taking: Reported on 08/07/2019 03/02/17   Gae Dry, MD  Misc Natural Products (AIRBORNE ELDERBERRY) CHEW Chew by mouth daily.    [provider]  oxycodone (OXY-IR) 5 MG capsule Take 5 mg by mouth every 4 (four) hours as needed.    [provider]  phentermine (ADIPEX-P) 37.5 MG tablet Take 1 tablet (37.5 mg total) by mouth daily before breakfast. Patient not taking: Reported on 08/07/2019 03/02/17   Gae Dry, MD  predniSONE (DELTASONE) 10 MG tablet Take 3 tablets once a day for 5 days 08/07/19   Johnn Hai, PA-C  tamoxifen (NOLVADEX) 20 MG tablet Take 20 mg by mouth daily. 11/22/18   [provider]    Allergies Erythromycin  Family History  Problem Relation Age of Onset  . Hyperlipidemia Mother   . Breast cancer Mother 41       currently 45  . Kidney disease Brother   . Hypertension Brother   . Hyperlipidemia Brother   . Alzheimer's disease Maternal Grandmother   . Heart disease Maternal Grandfather   . Lung cancer Paternal Grandfather        smoker  . Skin cancer Paternal Grandfather 6  . Skin cancer Maternal Aunt   . Skin cancer Paternal Aunt 23  . Cancer Other        3 brothers of maternal grandfather with lung, throat and salivary gland cancers; all were smokers.  . Colon cancer Neg Hx     Social History Social History   Tobacco Use  . Smoking status: Never Smoker  . Smokeless tobacco: Never Used  Substance Use Topics  . Alcohol use: Yes    Alcohol/week: 0.0 standard drinks    Comment: on occasion  . Drug use: No    Review of Systems Constitutional: No fever/chills Eyes: No visual changes. ENT: Positive for ear pain.  Positive posterior drainage.  Negative for taste and smell changes. Cardiovascular:  Denies chest pain. Respiratory: Denies shortness of breath.  Positive for cough. Gastrointestinal: No abdominal pain.  No nausea, no vomiting.  Genitourinary: Negative for dysuria. Musculoskeletal: Negative for back pain. Skin: Negative for rash. Neurological: Negative for headaches, focal weakness or numbness.  ____________________________________________   PHYSICAL EXAM: Constitutional: Alert and oriented. Well appearing and in no acute distress. Eyes: Conjunctivae are normal. PERRL. EOMI. Head: Atraumatic. Nose: Mild congestion/rhinnorhea.  EACs are clear.  TMs are dull with poor light reflex and fluid.  No erythema or injection is noted. Mouth/Throat: Mucous membranes are moist.  Oropharynx non-erythematous.  Posterior drainage present. Neck: No stridor.  Cardiovascular: Normal rate, regular rhythm. Grossly normal heart sounds.  Good peripheral circulation. Respiratory: Normal respiratory effort.  No retractions. Lungs CTAB. Musculoskeletal: Moves upper  and lower extremities they have difficulty.  Normal gait was noted. Neurologic:  Normal speech and language. No gross focal neurologic deficits are appreciated. No gait instability. Skin:  Skin is warm, dry and intact. No rash noted. Psychiatric: Mood and affect are normal. Speech and behavior are normal.  ____________________________________________ __________________________________________  FINAL CLINICAL IMPRESSION(S)   Eustachian tube dysfunction bilaterally with seasonal allergies.  Patient will begin on steroid nasal spray.  A prescription for prednisone 30 mg once a day for the next 5 days and Tessalon Perles was sent to her pharmacy.  She is encouraged to return if any continued problems or any changes in her symptoms.   ED Discharge Orders         Ordered    benzonatate (TESSALON) 200 MG capsule  3 times daily PRN     08/07/19 1142    predniSONE (DELTASONE) 10 MG tablet     08/07/19 1142           Note:  This  document was prepared using Dragon voice recognition software and may include unintentional dictation errors.

## 2020-03-06 ENCOUNTER — Ambulatory Visit: Payer: BC Managed Care – PPO | Admitting: Radiation Oncology

## 2020-03-18 ENCOUNTER — Ambulatory Visit: Payer: BC Managed Care – PPO | Admitting: Radiation Oncology

## 2020-04-02 ENCOUNTER — Ambulatory Visit: Payer: BC Managed Care – PPO | Admitting: Radiation Oncology

## 2020-05-07 ENCOUNTER — Ambulatory Visit: Payer: BC Managed Care – PPO | Admitting: Radiation Oncology

## 2020-05-23 ENCOUNTER — Ambulatory Visit
Admission: RE | Admit: 2020-05-23 | Discharge: 2020-05-23 | Disposition: A | Discharge status: Home / Self Care | Payer: BC Managed Care – PPO | Source: Ambulatory Visit | Attending: Radiation Oncology | Admitting: Radiation Oncology

## 2020-05-23 ENCOUNTER — Other Ambulatory Visit: Payer: Self-pay

## 2020-05-23 ENCOUNTER — Encounter: Payer: Self-pay | Admitting: Radiation Oncology

## 2020-05-23 VITALS — BP 118/82 | HR 71 | Temp 98.2°F | Wt 252.0 lb

## 2020-05-23 DIAGNOSIS — Z170 Estrogen receptor positive status [ER+]: Secondary | ICD-10-CM | POA: Insufficient documentation

## 2020-05-23 DIAGNOSIS — D0512 Intraductal carcinoma in situ of left breast: Secondary | ICD-10-CM | POA: Insufficient documentation

## 2020-05-23 DIAGNOSIS — Z923 Personal history of irradiation: Secondary | ICD-10-CM | POA: Insufficient documentation

## 2020-05-23 DIAGNOSIS — Z79810 Long term (current) use of selective estrogen receptor modulators (SERMs): Secondary | ICD-10-CM | POA: Insufficient documentation

## 2020-05-23 DIAGNOSIS — C50512 Malignant neoplasm of lower-outer quadrant of left female breast: Secondary | ICD-10-CM

## 2020-05-23 NOTE — Progress Notes (Signed)
Radiation Oncology Follow up Note  Name: Shannon Allen   Date:   05/23/2020 MRN:  [id number] DOB: [birth year]    This 45 y.o. female presents to the clinic today for 2-year follow-up status post whole breast radiation to her left breast for stage I ER/PR positive invasive mammary carcinoma.  REFERRING PROVIDER: Wayland Denis, PA-C  HPI: Patient is a 45 year old female now out 2 years having completed whole breast radiation to her left breast for stage I ER/PR positive invasive mammary carcinoma.  Seen today in routine follow-up she is doing well.  She specifically denies breast tenderness cough or bone pain..  She is followed at [facility] on her last mammogram had some suspicious calcifications at the lumpectomy site for which she underwent stereoscopic examination which was negative for intraepithelial lesion or malignancy.  She is currently on tamoxifen tolerant well without side effect.  COMPLICATIONS OF TREATMENT: none  FOLLOW UP COMPLIANCE: keeps appointments   PHYSICAL EXAM:  BP 118/82   Pulse 71   Temp 98.2 F (36.8 C) (Tympanic)   Wt 252 lb (114.3 kg)   BMI 45.36 kg/m  Lungs are clear to A&P cardiac examination essentially unremarkable with regular rate and rhythm. No dominant mass or nodularity is noted in either breast in 2 positions examined. Incision is well-healed. No axillary or supraclavicular adenopathy is appreciated. Cosmetic result is excellent.  Well-developed well-nourished patient in NAD. HEENT reveals PERLA, EOMI, discs not visualized.  Oral cavity is clear. No oral mucosal lesions are identified. Neck is clear without evidence of cervical or supraclavicular adenopathy. Lungs are clear to A&P. Cardiac examination is essentially unremarkable with regular rate and rhythm without murmur rub or thrill. Abdomen is benign with no organomegaly or masses noted. Motor sensory and DTR levels are equal and symmetric in the upper and lower extremities. Cranial nerves II through XII  are grossly intact. Proprioception is intact. No peripheral adenopathy or edema is identified. No motor or sensory levels are noted. Crude visual fields are within normal range.  RADIOLOGY RESULTS: Mammogram ultrasound reviewed compatible with above-stated findings as well as pathology results  PLAN: Present time patient is now 2 years out from whole breast radiation with no evidence of disease.  I am pleased with her overall progress.  I have asked to see her back in 1 year for follow-up.  She continues close follow-up care and oncology with [facility].  Patient knows to call with any concerns.  I would like to take this opportunity to thank you for allowing me to participate in the care of your patient.Noreene Filbert, MD

## 2021-05-22 ENCOUNTER — Ambulatory Visit: Payer: BC Managed Care – PPO | Admitting: Radiation Oncology

## 2021-06-19 ENCOUNTER — Ambulatory Visit: Payer: BC Managed Care – PPO | Admitting: Radiation Oncology

## 2021-07-01 ENCOUNTER — Ambulatory Visit
Admission: RE | Admit: 2021-07-01 | Discharge: 2021-07-01 | Disposition: A | Discharge status: Home / Self Care | Payer: BC Managed Care – PPO | Source: Ambulatory Visit | Attending: Radiation Oncology | Admitting: Radiation Oncology

## 2021-07-01 ENCOUNTER — Encounter: Payer: Self-pay | Admitting: Radiation Oncology

## 2021-07-01 VITALS — BP 127/90 | HR 94 | Temp 98.6°F | Resp 18 | Ht 62.0 in | Wt 252.8 lb

## 2021-07-01 DIAGNOSIS — Z79810 Long term (current) use of selective estrogen receptor modulators (SERMs): Secondary | ICD-10-CM | POA: Insufficient documentation

## 2021-07-01 DIAGNOSIS — Z923 Personal history of irradiation: Secondary | ICD-10-CM | POA: Insufficient documentation

## 2021-07-01 DIAGNOSIS — Z170 Estrogen receptor positive status [ER+]: Secondary | ICD-10-CM | POA: Diagnosis not present

## 2021-07-01 DIAGNOSIS — C50512 Malignant neoplasm of lower-outer quadrant of left female breast: Secondary | ICD-10-CM

## 2021-07-01 DIAGNOSIS — D0512 Intraductal carcinoma in situ of left breast: Secondary | ICD-10-CM | POA: Diagnosis present

## 2021-07-01 NOTE — Progress Notes (Signed)
Radiation Oncology ?Follow up Note ? ?Name: Haneefah Venturini [place]   ?Date:   07/01/2021 ?MRN:  [id number] ?DOB: May 02, 1975  ? ? ?This 46 y.o. female presents to the clinic today for 3-year follow-up status post whole breast radiation to left breast for stage I ER/PR positive invasive mammary carcinoma. ? ?REFERRING PROVIDER: Wayland Denis, PA-C ? ?HPI: Patient is a 46 year old female now out 3 years having completed whole breast radiation to her left breast for stage I ER/PR positive invasive mammary carcinoma seen today in routine follow-up she is doing well she specifically denies breast tenderness cough or bone pain.  She had mammograms back in January.  At [facility] which I have reviewed showing no evidence of disease.  She is currently on tamoxifen tolerating it well without side effect ? ?COMPLICATIONS OF TREATMENT: none ? ?FOLLOW UP COMPLIANCE: keeps appointments  ? ?PHYSICAL EXAM:  ?BP 127/90 (BP Location: Right Arm, Patient Position: Sitting)   Pulse 94   Temp 98.6 ?F (37 ?C) (Tympanic)   Resp 18   Ht '5\' 2"'$  (1.575 m)   Wt 252 lb 12.8 oz (114.7 kg)   BMI 46.24 kg/m?  ?Lungs are clear to A&P cardiac examination essentially unremarkable with regular rate and rhythm. No dominant mass or nodularity is noted in either breast in 2 positions examined. Incision is well-healed. No axillary or supraclavicular adenopathy is appreciated. Cosmetic result is excellent.  Well-developed well-nourished patient in NAD. HEENT reveals PERLA, EOMI, discs not visualized.  Oral cavity is clear. No oral mucosal lesions are identified. Neck is clear without evidence of cervical or supraclavicular adenopathy. Lungs are clear to A&P. Cardiac examination is essentially unremarkable with regular rate and rhythm without murmur rub or thrill. Abdomen is benign with no organomegaly or masses noted. Motor sensory and DTR levels are equal and symmetric in the upper and lower extremities. Cranial nerves II through XII are grossly intact.  Proprioception is intact. No peripheral adenopathy or edema is identified. No motor or sensory levels are noted. Crude visual fields are within normal range. ? ?RADIOLOGY RESULTS: Mammograms reviewed compatible with above-stated findings ? ?PLAN: Present time patient is now out over 3 years with no evidence of disease.  She continues close follow-up care with surgeon as well as medical oncology.  I am going to discontinue follow-up care at this time.  Patient knows to call with any concerns at any time. ? ?I would like to take this opportunity to thank you for allowing me to participate in the care of your patient.. ?  ? Noreene Filbert, MD ? ?

## 2022-12-08 NOTE — Anesthesia Preprocedure Evaluation (Signed)
Anesthesia Evaluation  Patient identified by MRN, date of birth, ID band Patient awake    Reviewed: Allergy & Precautions, H&P , NPO status , Patient's Chart, lab work & pertinent test results  History of Anesthesia Complications Negative for: history of anesthetic complications  Airway Mallampati: II  TM Distance: >3 FB Neck ROM: full    Dental no notable dental hx.    Pulmonary asthma    Pulmonary exam normal        Cardiovascular negative cardio ROS Normal cardiovascular exam     Neuro/Psych  PSYCHIATRIC DISORDERS Anxiety Depression    negative neurological ROS     GI/Hepatic Neg liver ROS,GERD  ,,  Endo/Other    Morbid obesity  Renal/GU negative Renal ROS  negative genitourinary   Musculoskeletal   Abdominal   Peds  Hematology negative hematology ROS (+)   Anesthesia Other Findings Past Medical History: No date: ADHD (attention deficit hyperactivity disorder) No date: Anxiety     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 03/2017: Breast cancer ([facility]) No date: Depression 2005: Endometriosis determined by laparoscopy 05/12/2017: Genetic testing     Comment:  Breast/GYN panel (23 genes) @ [facility] - No pathogenic               mutations detected No date: GERD (gastroesophageal reflux disease) No date: Headache No date: History of kidney stones 02/18/2015: Major depression in full remission [facility])  Past Surgical History: 03/09/2017: BREAST BIOPSY; Left     Comment:  left breast stereo path ATYPICAL INTRADUCTAL               PROLIFERATION  04/01/2017: BREAST BIOPSY; Left     Comment:  Procedure: BREAST BIOPSY WITH NEEDLE LOCALIZATION;                Surgeon: Earline Mayotte, MD;  Location: [facility] ORS;                Service: General;  Laterality: Left; 04/01/2017: BREAST EXCISIONAL BIOPSY; Left     Comment:  lumpectomy done by Dr. Lemar Livings 2005: laproscopic surgery No date: TONSILLECTOMY  BMI 46     Reproductive/Obstetrics negative OB ROS                             Anesthesia Physical Anesthesia Plan  ASA: 3  Anesthesia Plan: General   Post-op Pain Management: Minimal or no pain anticipated   Induction: Intravenous  PONV Risk Score and Plan: 2 and Propofol infusion and TIVA  Airway Management Planned: Natural Airway  Additional Equipment:   Intra-op Plan:   Post-operative Plan:   Informed Consent: I have reviewed the patients History and Physical, chart, labs and discussed the procedure including the risks, benefits and alternatives for the proposed anesthesia with the patient or authorized representative who has indicated his/her understanding and acceptance.     Dental Advisory Given  Plan Discussed with: CRNA and Surgeon  Anesthesia Plan Comments: (Patient consented for risks of anesthesia including but not limited to:  - adverse reactions to medications - risk of airway placement if required - damage to eyes, teeth, lips or other oral mucosa - nerve damage due to positioning  - sore throat or hoarseness - Damage to heart, brain, nerves, lungs, other parts of body or loss of life  Patient voiced understanding.)        Anesthesia Quick Evaluation

## 2022-12-09 ENCOUNTER — Encounter
Admission: RE | Disposition: A | Discharge status: Home / Self Care | Payer: Self-pay | Source: Home / Self Care | Attending: Gastroenterology

## 2022-12-09 ENCOUNTER — Ambulatory Visit
Admission: RE | Admit: 2022-12-09 | Discharge: 2022-12-09 | Disposition: A | Discharge status: Home / Self Care | Payer: BC Managed Care – PPO | Attending: Gastroenterology | Admitting: Gastroenterology

## 2022-12-09 ENCOUNTER — Other Ambulatory Visit: Payer: Self-pay

## 2022-12-09 ENCOUNTER — Ambulatory Visit: Payer: Self-pay | Admitting: Anesthesiology

## 2022-12-09 ENCOUNTER — Ambulatory Visit: Payer: BC Managed Care – PPO | Admitting: Anesthesiology

## 2022-12-09 ENCOUNTER — Encounter: Payer: Self-pay | Admitting: Gastroenterology

## 2022-12-09 DIAGNOSIS — K640 First degree hemorrhoids: Secondary | ICD-10-CM | POA: Insufficient documentation

## 2022-12-09 DIAGNOSIS — Z6841 Body Mass Index (BMI) 40.0 and over, adult: Secondary | ICD-10-CM | POA: Insufficient documentation

## 2022-12-09 DIAGNOSIS — F419 Anxiety disorder, unspecified: Secondary | ICD-10-CM | POA: Diagnosis not present

## 2022-12-09 DIAGNOSIS — Z1211 Encounter for screening for malignant neoplasm of colon: Secondary | ICD-10-CM | POA: Diagnosis present

## 2022-12-09 DIAGNOSIS — F32A Depression, unspecified: Secondary | ICD-10-CM | POA: Insufficient documentation

## 2022-12-09 HISTORY — PX: COLONOSCOPY WITH PROPOFOL: SHX5780

## 2022-12-09 LAB — POCT PREGNANCY, URINE: Preg Test, Ur: NEGATIVE

## 2022-12-09 SURGERY — COLONOSCOPY WITH PROPOFOL
Anesthesia: General

## 2022-12-09 MED ORDER — SODIUM CHLORIDE 0.9 % IV SOLN
INTRAVENOUS | Status: DC | PRN
Start: 2022-12-09 — End: 2022-12-09

## 2022-12-09 MED ORDER — PROPOFOL 500 MG/50ML IV EMUL
INTRAVENOUS | Status: DC | PRN
  Administered 2022-12-09: 150 ug/kg/min via INTRAVENOUS
  Administered 2022-12-09: 70 mg via INTRAVENOUS

## 2022-12-09 MED ORDER — ONDANSETRON HCL 4 MG/2ML IJ SOLN
INTRAMUSCULAR | Status: DC | PRN
  Administered 2022-12-09: 4 mg via INTRAVENOUS

## 2022-12-09 NOTE — H&P (Signed)
Pre-Procedure H&P   Patient ID: Shannon Allen is a 47 y.o. female.  Gastroenterology Provider: Jaynie Collins, DO  Referring Provider: Carren Rang, PA PCP: Carren Rang, PA-C  Date: 12/09/2022  HPI Shannon Allen is a 47 y.o. female who presents today for Colonoscopy for Colorectal cancer screening . Reports daily bowel movement without melena or hematochezia.  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 Lemar Livings   laproscopic surgery  2005   TONSILLECTOMY      Family History No h/o GI disease or malignancy  Review of Systems  Constitutional:  Negative for activity change, appetite change, chills, diaphoresis, fatigue, fever and unexpected weight change.  HENT:  Negative for trouble swallowing and voice change.   Respiratory:  Negative for shortness of breath and wheezing.   Cardiovascular:  Negative for chest pain, palpitations and leg swelling.  Gastrointestinal:  Negative for abdominal distention, abdominal pain, anal bleeding, blood in stool,  constipation, diarrhea, nausea, rectal pain and vomiting.  Musculoskeletal:  Negative for arthralgias and myalgias.  Skin:  Negative for color change and pallor.  Neurological:  Negative for dizziness, syncope and weakness.  Psychiatric/Behavioral:  Negative for confusion.   All other systems reviewed and are negative.    Medications No current facility-administered medications on file prior to encounter.   Current Outpatient Medications on File Prior to Encounter  Medication Sig Dispense Refill   BIOTIN PO Take by mouth.     cetirizine (ZYRTEC) 10 MG tablet Take 10 mg by mouth at bedtime.      diclofenac (CATAFLAM) 50 MG tablet Take 1 tablet (50 mg total) by mouth 2 (two) times daily. (Patient taking differently: Take 50 mg by mouth 2 (two) times daily as needed (for pain/cramps).) 60 tablet 6   ELDERBERRY PO Take by mouth.     fluticasone (FLONASE) 50 MCG/ACT nasal spray Place 2 sprays into both nostrils daily as needed (for allergies.).      hydrOXYzine (ATARAX) 50 MG tablet Take 50 mg by mouth 3 (three) times daily as needed. Patient doesn't know dose. Last dose today at 0600     ibuprofen (ADVIL,MOTRIN) 200 MG tablet Take 400-600 mg by mouth every 8 (eight) hours as needed (for pain.).     Misc Natural Products (AIRBORNE ELDERBERRY) CHEW Chew by mouth daily.     montelukast (SINGULAIR) 10 MG tablet TAKE 1 TABLET BY MOUTH  NIGHTLY     vitamin B-12 (CYANOCOBALAMIN) 500 MCG tablet Take 500 mcg by mouth daily.  ALPRAZolam (XANAX) 0.5 MG tablet Take 1 tablet (0.5 mg total) by mouth 2 (two) times daily as needed for anxiety. (Patient taking differently: Take 0.5 mg by mouth 2 (two) times daily as needed for anxiety (For surgery).) 5 tablet 0   levonorgestrel (MIRENA) 20 MCG/24HR IUD 1 each by Intrauterine route once.     Levonorgestrel-Ethinyl Estradiol (AMETHIA,CAMRESE) 0.15-0.03 &0.01 MG tablet Take 1 tablet by mouth daily. (Patient not taking: Reported on 07/01/2021) 91 tablet 3    oxycodone (OXY-IR) 5 MG capsule Take 5 mg by mouth every 4 (four) hours as needed. (Patient not taking: Reported on 05/23/2020)     phentermine (ADIPEX-P) 37.5 MG tablet Take 1 tablet (37.5 mg total) by mouth daily before breakfast. (Patient not taking: Reported on 08/07/2019) 30 tablet 1   predniSONE (DELTASONE) 10 MG tablet Take 3 tablets once a day for 5 days (Patient not taking: Reported on 05/23/2020) 15 tablet 0   tamoxifen (NOLVADEX) 20 MG tablet Take 20 mg by mouth daily. (Patient not taking: Reported on 12/09/2022)     venlafaxine XR (EFFEXOR-XR) 75 MG 24 hr capsule Take by mouth.      Pertinent medications related to GI and procedure were reviewed by me with the patient prior to the procedure  No current facility-administered medications for this encounter.      Allergies  Allergen Reactions   Erythromycin Anaphylaxis, Shortness Of Breath and Other (See Comments)    shock   Allergies were reviewed by me prior to the procedure  Objective   Body mass index is 46.62 kg/m. Vitals:   12/09/22 0814  BP: 137/86  Pulse: 78  Resp: 16  Temp: 98 F (36.7 C)  TempSrc: Oral  SpO2: 96%  Weight: 119.4 kg  Height: 5\' 3"  (1.6 m)     Physical Exam Vitals and nursing note reviewed.  Constitutional:      General: She is not in acute distress.    Appearance: Normal appearance. She is obese. She is not ill-appearing, toxic-appearing or diaphoretic.  HENT:     Head: Normocephalic and atraumatic.     Nose: Nose normal.     Mouth/Throat:     Mouth: Mucous membranes are moist.     Pharynx: Oropharynx is clear.  Eyes:     General: No scleral icterus.    Extraocular Movements: Extraocular movements intact.  Cardiovascular:     Rate and Rhythm: Normal rate and regular rhythm.     Heart sounds: Normal heart sounds. No murmur heard.    No friction rub. No gallop.  Pulmonary:     Effort: Pulmonary effort is normal. No respiratory distress.     Breath sounds: Normal breath sounds. No  wheezing, rhonchi or rales.  Abdominal:     General: Bowel sounds are normal. There is no distension.     Palpations: Abdomen is soft.     Tenderness: There is no abdominal tenderness. There is no guarding or rebound.  Musculoskeletal:     Cervical back: Neck supple.     Right lower leg: No edema.     Left lower leg: No edema.  Skin:    General: Skin is warm and dry.     Coloration: Skin is not jaundiced or pale.  Neurological:     General: No focal deficit present.     Mental Status: She is alert and oriented to person, place, and time. Mental status is at baseline.  Psychiatric:        Mood and Affect:  Mood normal.        Behavior: Behavior normal.        Thought Content: Thought content normal.        Judgment: Judgment normal.      Assessment:  Shannon Allen is a 47 y.o. female  who presents today for Colonoscopy for Colorectal cancer screening .  Plan:  Colonoscopy with possible intervention today  Colonoscopy with possible biopsy, control of bleeding, polypectomy, and interventions as necessary has been discussed with the patient/patient representative. Informed consent was obtained from the patient/patient representative after explaining the indication, nature, and risks of the procedure including but not limited to death, bleeding, perforation, missed neoplasm/lesions, cardiorespiratory compromise, and reaction to medications. Opportunity for questions was given and appropriate answers were provided. Patient/patient representative has verbalized understanding is amenable to undergoing the procedure.   Jaynie Collins, DO  [facility] Gastroenterology  Portions of the record may have been created with voice recognition software. Occasional wrong-word or 'sound-a-like' substitutions may have occurred due to the inherent limitations of voice recognition software.  Read the chart carefully and recognize, using context, where substitutions may have occurred.

## 2022-12-09 NOTE — Transfer of Care (Signed)
Immediate Anesthesia Transfer of Care Note  Patient: Shannon Allen [place]  Procedure(s) Performed: COLONOSCOPY WITH PROPOFOL  Patient Location: PACU  Anesthesia Type:MAC  Level of Consciousness: drowsy  Airway & Oxygen Therapy: Patient Spontanous Breathing and Patient connected to face mask oxygen  Post-op Assessment: Report given to RN and Post -op Vital signs reviewed and stable  Post vital signs: Reviewed  Last Vitals:  Vitals Value Taken Time  BP 122/59 12/09/22 0847  Temp 36.3 C 12/09/22 0846  Pulse 84 12/09/22 0848  Resp 14 12/09/22 0851  SpO2 97 % 12/09/22 0848  Vitals shown include unfiled device data.  Last Pain:  Vitals:   12/09/22 0846  TempSrc: Temporal  PainSc:          Complications: No notable events documented.

## 2022-12-09 NOTE — Anesthesia Postprocedure Evaluation (Signed)
Anesthesia Post Note  Patient: Shannon Allen  Procedure(s) Performed: COLONOSCOPY WITH PROPOFOL  Patient location during evaluation: Endoscopy Anesthesia Type: General Level of consciousness: awake and alert Pain management: pain level controlled Vital Signs Assessment: post-procedure vital signs reviewed and stable Respiratory status: spontaneous breathing, nonlabored ventilation, respiratory function stable and patient connected to nasal cannula oxygen Cardiovascular status: blood pressure returned to baseline and stable Postop Assessment: no apparent nausea or vomiting Anesthetic complications: no   No notable events documented.   Last Vitals:  Vitals:   12/09/22 0846 12/09/22 0856  BP: (!) 122/59   Pulse:  86  Resp: (!) 21   Temp: (!) 36.3 C   SpO2: 98% 100%    Last Pain:  Vitals:   12/09/22 0856  TempSrc:   PainSc: 0-No pain                 Louie Boston

## 2022-12-09 NOTE — Op Note (Signed)
[facility] Gastroenterology Patient Name: Shannon Allen [place] Procedure Date: 12/09/2022 8:05 AM MRN: [id number] Account #: [id number] Date of Birth: [birth year] Admit Type: Outpatient Age: 47 Room: [facility] ENDO ROOM 1 Gender: Female Note Status: Finalized Instrument Name: Colonoscope [id number] Procedure:             Colonoscopy Indications:           Screening for colorectal malignant neoplasm Providers:             Trenda Moots, DO Referring MD:          Carren Rang (Referring MD) Medicines:             Monitored Anesthesia Care Complications:         No immediate complications. Estimated blood loss: None. Procedure:             Pre-Anesthesia Assessment:                        - Prior to the procedure, a History and Physical was                         performed, and patient medications and allergies were                         reviewed. The patient is competent. The risks and                         benefits of the procedure and the sedation options and                         risks were discussed with the patient. All questions                         were answered and informed consent was obtained.                         Patient identification and proposed procedure were                         verified by the physician, the nurse, the anesthetist                         and the technician in the endoscopy suite. Mental                         Status Examination: alert and oriented. Airway                         Examination: normal oropharyngeal airway and neck                         mobility. Respiratory Examination: clear to                         auscultation. CV Examination: RRR, no murmurs, no S3                         or S4. Prophylactic Antibiotics: The patient does not  require prophylactic antibiotics. Prior                         Anticoagulants: The patient has taken no anticoagulant                         or  antiplatelet agents. ASA Grade Assessment: III - A                         patient with severe systemic disease. After reviewing                         the risks and benefits, the patient was deemed in                         satisfactory condition to undergo the procedure. The                         anesthesia plan was to use monitored anesthesia care                         (MAC). Immediately prior to administration of                         medications, the patient was re-assessed for adequacy                         to receive sedatives. The heart rate, respiratory                         rate, oxygen saturations, blood pressure, adequacy of                         pulmonary ventilation, and response to care were                         monitored throughout the procedure. The physical                         status of the patient was re-assessed after the                         procedure.                        After obtaining informed consent, the colonoscope was                         passed under direct vision. Throughout the procedure,                         the patient's blood pressure, pulse, and oxygen                         saturations were monitored continuously. The                         Colonoscope was introduced through the anus and  advanced to the the cecum, identified by appendiceal                         orifice and ileocecal valve. The colonoscopy was                         performed without difficulty. The patient tolerated                         the procedure well. The quality of the bowel                         preparation was evaluated using the BBPS [facility] Bowel                         Preparation Scale) with scores of: Right Colon = 3,                         Transverse Colon = 3 and Left Colon = 3 (entire mucosa                         seen well with no residual staining, small fragments                         of stool or  opaque liquid). The total BBPS score                         equals 9. The ileocecal valve, appendiceal orifice,                         and rectum were photographed. Findings:      The perianal and digital rectal examinations were normal. Pertinent       negatives include normal sphincter tone.      Non-bleeding internal hemorrhoids were found during retroflexion. The       hemorrhoids were Grade I (internal hemorrhoids that do not prolapse).       Estimated blood loss: none.      The entire examined colon appeared normal on direct and retroflexion       views. Impression:            - Non-bleeding internal hemorrhoids.                        - The entire examined colon is normal on direct and                         retroflexion views.                        - No specimens collected. Recommendation:        - Patient has a contact number available for                         emergencies. The signs and symptoms of potential                         delayed complications were discussed with the patient.  Return to normal activities tomorrow. Written                         discharge instructions were provided to the patient.                        - Discharge patient to home.                        - Resume previous diet.                        - Continue present medications.                        - Repeat colonoscopy in 10 years for screening                         purposes.                        - Return to referring physician as previously                         scheduled.                        - The findings and recommendations were discussed with                         the patient. Procedure Code(s):     --- Professional ---                        [phone], Colonoscopy, flexible; diagnostic, including                         collection of specimen(s) by brushing or washing, when                         performed (separate procedure) Diagnosis Code(s):      --- Professional ---                        Z12.11, Encounter for screening for malignant neoplasm                         of colon                        K64.0, First degree hemorrhoids CPT copyright 2022 American [employer] Association. All rights reserved. The codes documented in this report are preliminary and upon coder review may  be revised to meet current compliance requirements. Attending Participation:      I personally performed the entire procedure. Elfredia Nevins, DO Jaynie Collins DO, DO 12/09/2022 8:45:31 AM This report has been signed electronically. Number of Addenda: 0 Note Initiated On: 12/09/2022 8:05 AM Scope Withdrawal Time: 0 hours 9 minutes 36 seconds  Total Procedure Duration: 0 hours 13 minutes 56 seconds  Estimated Blood Loss:  Estimated blood loss: none.      [facility]

## 2022-12-09 NOTE — Interval H&P Note (Signed)
History and Physical Interval Note: Preprocedure H&P from 12/09/22  was reviewed and there was no interval change after seeing and examining the patient.  Written consent was obtained from the patient after discussion of risks, benefits, and alternatives. Patient has consented to proceed with Colonoscopy with possible intervention   12/09/2022 8:17 AM  Shannon Allen  has presented today for surgery, with the diagnosis of Z12.11 (ICD-10-CM) - Colon cancer screening.  The various methods of treatment have been discussed with the patient and family. After consideration of risks, benefits and other options for treatment, the patient has consented to  Procedure(s) with comments: COLONOSCOPY WITH PROPOFOL (N/A) - DM as a surgical intervention.  The patient's history has been reviewed, patient examined, no change in status, stable for surgery.  I have reviewed the patient's chart and labs.  Questions were answered to the patient's satisfaction.     Jaynie Collins

## 2022-12-10 ENCOUNTER — Encounter: Payer: Self-pay | Admitting: Gastroenterology
